# Patient Record
Sex: Male | Born: 1950 | Hispanic: Yes | Marital: Married | Smoking: Former smoker
Health system: Southern US, Community
[De-identification: ages and names within clinical notes are randomized; demographics above are authoritative.]

## PROBLEM LIST (undated history)

## (undated) DIAGNOSIS — E119 Type 2 diabetes mellitus without complications: Secondary | ICD-10-CM

## (undated) DIAGNOSIS — E785 Hyperlipidemia, unspecified: Secondary | ICD-10-CM

## (undated) HISTORY — DX: Hyperlipidemia, unspecified: E78.5

## (undated) HISTORY — PX: APPENDECTOMY: SHX54

---

## 2014-03-22 ENCOUNTER — Emergency Department (HOSPITAL_COMMUNITY)
Admission: EM | Admit: 2014-03-22 | Discharge: 2014-03-22 | Disposition: A | Discharge status: Home / Self Care | Payer: Self-pay | Attending: Emergency Medicine | Admitting: Emergency Medicine

## 2014-03-22 ENCOUNTER — Encounter (HOSPITAL_COMMUNITY): Payer: Self-pay | Admitting: Emergency Medicine

## 2014-03-22 DIAGNOSIS — E875 Hyperkalemia: Secondary | ICD-10-CM

## 2014-03-22 DIAGNOSIS — E119 Type 2 diabetes mellitus without complications: Secondary | ICD-10-CM | POA: Insufficient documentation

## 2014-03-22 DIAGNOSIS — R739 Hyperglycemia, unspecified: Secondary | ICD-10-CM

## 2014-03-22 HISTORY — DX: Type 2 diabetes mellitus without complications: E11.9

## 2014-03-22 LAB — URINALYSIS, ROUTINE W REFLEX MICROSCOPIC
BILIRUBIN URINE: NEGATIVE
Hgb urine dipstick: NEGATIVE
KETONES UR: NEGATIVE mg/dL
Leukocytes, UA: NEGATIVE
Nitrite: NEGATIVE
PH: 5.5 (ref 5.0–8.0)
PROTEIN: NEGATIVE mg/dL
Specific Gravity, Urine: 1.01 (ref 1.005–1.030)
Urobilinogen, UA: 0.2 mg/dL (ref 0.0–1.0)

## 2014-03-22 LAB — CBC WITH DIFFERENTIAL/PLATELET
BASOS ABS: 0 10*3/uL (ref 0.0–0.1)
Basophils Relative: 0 % (ref 0–1)
EOS ABS: 0.1 10*3/uL (ref 0.0–0.7)
Eosinophils Relative: 2 % (ref 0–5)
HEMATOCRIT: 42.4 % (ref 39.0–52.0)
Hemoglobin: 14.5 g/dL (ref 13.0–17.0)
LYMPHS PCT: 27 % (ref 12–46)
Lymphs Abs: 1.6 10*3/uL (ref 0.7–4.0)
MCH: 30.9 pg (ref 26.0–34.0)
MCHC: 34.2 g/dL (ref 30.0–36.0)
MCV: 90.2 fL (ref 78.0–100.0)
MONO ABS: 0.5 10*3/uL (ref 0.1–1.0)
Monocytes Relative: 9 % (ref 3–12)
Neutro Abs: 3.7 10*3/uL (ref 1.7–7.7)
Neutrophils Relative %: 62 % (ref 43–77)
Platelets: 278 10*3/uL (ref 150–400)
RBC: 4.7 MIL/uL (ref 4.22–5.81)
RDW: 12.2 % (ref 11.5–15.5)
WBC: 5.9 10*3/uL (ref 4.0–10.5)

## 2014-03-22 LAB — BASIC METABOLIC PANEL
BUN: 16 mg/dL (ref 6–23)
CO2: 31 meq/L (ref 19–32)
CREATININE: 0.75 mg/dL (ref 0.50–1.35)
Calcium: 9.7 mg/dL (ref 8.4–10.5)
Chloride: 96 mEq/L (ref 96–112)
GFR calc Af Amer: >90 mL/min (ref >90)
GFR calc non Af Amer: >90 mL/min (ref >90)
GLUCOSE: 457 mg/dL — AB (ref 70–99)
Potassium: 5.7 mEq/L — ABNORMAL HIGH (ref 3.7–5.3)
Sodium: 137 mEq/L (ref 137–147)

## 2014-03-22 LAB — I-STAT CHEM 8, ED
BUN: 14 mg/dL (ref 6–23)
CREATININE: 0.8 mg/dL (ref 0.50–1.35)
Calcium, Ion: 1.23 mmol/L (ref 1.13–1.30)
Chloride: 102 mEq/L (ref 96–112)
Glucose, Bld: 94 mg/dL (ref 70–99)
HCT: 48 % (ref 39.0–52.0)
Hemoglobin: 16.3 g/dL (ref 13.0–17.0)
Potassium: 3.4 mEq/L — ABNORMAL LOW (ref 3.7–5.3)
SODIUM: 143 meq/L (ref 137–147)
TCO2: 26 mmol/L (ref 0–100)

## 2014-03-22 LAB — URINE MICROSCOPIC-ADD ON

## 2014-03-22 LAB — CBG MONITORING, ED
Glucose-Capillary: 418 mg/dL — ABNORMAL HIGH (ref 70–99)
Glucose-Capillary: 91 mg/dL (ref 70–99)

## 2014-03-22 MED ORDER — METFORMIN HCL 500 MG PO TABS: 500.0000 mg | ORAL_TABLET | Freq: Two times a day (BID) | ORAL | Status: DC

## 2014-03-22 MED ORDER — SODIUM CHLORIDE 0.9 % IV BOLUS (SEPSIS)
1000.0000 mL | Freq: Once | INTRAVENOUS | Status: AC
  Administered 2014-03-22: 1000 mL via INTRAVENOUS

## 2014-03-22 MED ORDER — FUROSEMIDE 10 MG/ML IJ SOLN
40.0000 mg | Freq: Once | INTRAMUSCULAR | Status: AC
  Administered 2014-03-22: 40 mg via INTRAVENOUS
  Filled 2014-03-22: qty 4

## 2014-03-22 MED ORDER — INSULIN ASPART 100 UNIT/ML IV SOLN
10.0000 [IU] | Freq: Once | INTRAVENOUS | Status: AC
  Administered 2014-03-22: 10 [IU] via INTRAVENOUS

## 2014-03-22 MED ORDER — METFORMIN HCL 500 MG PO TABS
500.0000 mg | ORAL_TABLET | Freq: Once | ORAL | Status: AC
  Administered 2014-03-22: 500 mg via ORAL
  Filled 2014-03-22: qty 1

## 2014-03-22 NOTE — Discharge Instructions (Signed)
It is important to continue to take all medication as directed and have your blood rechecked in one week.  Return here for any concerning changes in your condition.   Hiperglucemia (Hyperglycemia) La hiperglucemia ocurre cuando la glucosa (azcar) en su sangre est demasiado elevada. Puede suceder por varias razones, pero a menudo ocurre en personas que no saben que tienen diabetes o no la controlan adecuadamente.  CAUSAS Tanto si tiene diabetes como si no, existen otras causas para la hiperglucemia. La hiperglucemia puede producirse cuando tiene diabetes, pero tambin puede presentarse en otras situaciones de las que podra no ser consciente, como por ejemplo: Diabetes  Si tiene diabetes y tiene problemas para controlar su glucosa en sangre, la hiperglucemia podra producirse debido a las siguientes razones:  No seguir Press photographerel plan de alimentacin.  No tomar los medicamentos para la diabetes o tomarlos de forma inadecuada.  Realizar menos ejercicio del que normalmente hace.  Estar enfermo. Prediabetes  Esto no puede ignorarse. Antes de que la persona presente diabetes de tipo 2, casi siempre hay "prediabetes". Esto ocurre cuando su glucosa en sangre es mayor que lo normal, pero no lo suficiente como para diagnosticar diabetes. La investigacin ha demostrado que algunos daos al cuerpo de Air cabin crewlargo plazo, en especial los del corazn y el sistema circulatorio, podran haber ocurrido durante el periodo de prediabetes. Si controla la glucosa en sangre cuando tiene prediabetes, podr retardar o evitar que se desarrrolle la diabetes tipo 2. El estrs  Si tiene diabetes, deber hacer una dieta, tomar medicamentos orales o insulina para Morris Chapelcontrolarla. Sin embargo, Clinical research associateencontrar que la glucosa en sangre es mayor que lo normal en el hospital tenga o no diabetes. Cientficamente se lo denomina "hiperglucemia por estrs". El estrs puede elevar su glucosa en sangre. Esto ocurre porque el organismo genera hormonas  en los momentos de estrs. Si el estrs ha Loews Corporationsido la causa del alto nivel de glucosa en [place]re, Oregonel mdico podr Education officer, environmentalrealizar un seguimiento de Summersetmanera regular. Feliberto Hartse esta manera, podr asegurarse de que la hiperglucemia no empeora o progresa hacia diabetes. Esteroides  Los esteroides son medicamentos que actan en la infeccin que ataca al sistema inmunolgico para bloquear la inflamacin o la infeccin. Un efecto secundario puede ser el aumento de glucosa en [place]angre. Muchas personas pueden producir la suficiente insulina extra para este aumento, pero aquellos que no pueden, los esteroides pueden Group 1 Automotivehacer que los niveles sean an Pe Ellmayores. No es inusual que los tratamientos con esteorides "destapen" una diabetes que se est desarrollando. No siempre es posible determinar si la hiperglucemia desaparecer una vez que se detenga el consumo de esteroides. A veces se realiza un anlisis de sangre especial denominado A1c para determinar si la glucosa en sangre se ha elevado antes de comenzar con el consumo de esteroides. SNTOMAS  Sed.  Necesidad frecuente de Geographical information systems officerorinar.  M.D.C. HoldingsBoca seca.  Visin borrosa.  Cansancio o fatiga.  Debilidad.  Somnolencia.  Hormigueo en el pie o pierna. DIAGNSTICO El diagnstico se realiza mediante el control de la glucosa en sangre de una o varias de las siguientes maneras:  Anlisis A1c. Es una sustancia qumica que se encuentra en la [place]re.  Control de glucosa en sangre con tiras de prueba.  Resultados de laboratorio. TRATAMIENTO Primero, es importante conocer la causa de la hiperglucemia antes de tratarla. El tratamiento puede ser el siguiente, [place]unque pueden ser otros:  Educacin  Cambios o ajustes en los medicamentos.  Cambios o ajustes en el plan de alimentacin.  Tratamiento por enfermedades, infecciones, etc.  Control de glucosa en sangre ms frecuente.  Cambios en el plan de ejercicios.  Disminucin o interrupcin del consumo de esteroides.  Cambios en el estilo  de vida. INSTRUCCIONES PARA EL CUIDADO DOMICILIARIO  Contrlese la glucosa en sangre, como se lo indicaron.  Haga ejercicios regularmente. El profesional que lo asiste le dar instrucciones relacionadas con el ejercicio fsico. La prediabetes que es consecuencia de situaciones de estrs, puede mejorar con la actividad fsica.  Consuma alimentos saludables y balanceados. Coma a menudo y de [place] regular, en momentos fijos. El profesional o el nutricionista le dar una dieta especial para controlar su ingestin de azcar.  Mantener su peso ideal es importante. Si lo necesita, perder un poco de peso, como 5  7 Kg. puede ser beneficioso para [employer] niveles de Production assistant, radio. SOLICITE ATENCIN MDICA SI:  Tiene preguntas relacionadas con los medicamentos, la actividad o la dieta.  Contina teniendo sntomas (como mucha sed, deseos intensos de Geographical information systems officer o aumento de peso) SOLICITE ATENCIN MDICA DE INMEDIATO SI:  Vomita o tiene diarrea.  Su respiracin huele frutal.  La frecuencia respiratoria es ms rpida o ms lenta.  Est somnoliento o incoherente.  Siente adormecimiento, hormigueos o Tax adviser o en las manos.  Siente dolor en el pecho.  Sus sntomas empeoran aunque haya seguido las indicaciones de su mdico.  Tiene otras preguntas o preocupaciones. Document Released: 11/23/2005 Document Revised: 02/15/2012 [facility] Patient Information 2014 [place], [place].  Hiperkalemia (Hyperkalemia) La hiperkalemia se produce cuando hay demasiado potasio en la sangre. Puede poner en peligro la vida. Normalmente los riones eliminan (excretan)el potasio del organismo.  CAUSAS Los niveles de potasio pueden elevarse de los siguientes modos:  Usted toma demasiado potasio. Lo hace cuando:  Utiliza sustitutos de la sal. Ellos contienen grandes cantidades.  El mdico le indica suplementos de potasio. La dosis puede ser muy alta para usted.  Consume alimentos o productos  dietticos con potasio.  Elimina (excreta) muy poco potasio. Esto puede ocurrir si:  Sus riones no funcionan correctamente. Las enfermedades renales son [place]uffel muy frecuente de hiperkalemia.  Toma medicamentos que disminuyen la excrecin de potasio (por ejemplo, ciertos diurticos).  Sufre una enfermedad de la glndula suprarrenal llamada enfermedad de [place].  Tiene una obstruccin en el tracto urinario, como clculos en los riones.  Est en tratamiento para limpiar mecnicamente la sangre (dilisis)y saltea un tratamiento.  Libera gran cantidad de potasio desde las clulas al torrente sanguneo. Puede sufrir una enfermedad que hace que el potasio vaya desde las clulas al torrente sanguneo. Esto puede ocurrir si:  Hay una lesin [employer] u otros tejidos. La mayor parte del potasio se encuentra en los msculos.  Tambin las quemaduras graves o las infecciones.  Hay acidificacin del plasma sanguneo o acidosis. La acidosis puede ser el resultado de muchas enfermedades, como diabetes sin controlarse. SNTOMAS Generalmente no hay sntomas, excepto que el potasio est peligrosamente elevado o se ha elevado muy rpidamente. Los sntomas son:  Latidos cardacos irregulares o muy lentos.  Ganas de vomitar (nuseas).  Cansancio intenso (fatiga).  Problemas neurolgicos tales como hormigueos en la piel, adormecimiento de manos o pies, debilidad o parlisis. DIAGNSTICO Un simple anlisis de sangre puede medir la cantidad de potasio en el organismo. Un electrocardiograma tambin ayuda a realizar el diagnstico. Cuando la hiperkalemia es grave, el corazn puede latir peligrosamente rpido, lento o detenerse completamente.  TRATAMIENTO El tratamiento depende de la gravedad de la enfermedad y de la causa subyacente.  En caso de ser Neomia Dearuna emergencia (causa problemas cardacos o parlisis), podrn indicarle diferentes medicamentos que se usan solos o en conjunto para disminuir el  nivel de potasio rpidamente. Podrn indicarle una inyeccin de insulina an si no es diabtico. Podra ser necesario que se someta a una limpieza mecnica de emergencia de la sangre (dilisis) para retirar el potasio del organismo.  Si la hipercalemia es menos grave, se trata la causa subyacente. Si es Copywriter, advertisingnecesario le prescribirn medicamentos. Podrn cambiarle los Cardinal Healthmedicamentos que le prescriben. Adems necesitar un medicamento que ayude a su organismo a Psychologist, counsellingeliminar potasio. Deber consumir una dieta baja en potasio. INSTRUCCIONES PARA EL CUIDADO DOMICILIARIO  Baxter Internationalome los medicamentos y los suplementos como le indic el profesional que lo asiste.  NO tome ningn medicamento de venta libre, ni suplementos, productos naturales, hierbas o vitaminas sin consultar con su mdico. Ciertos suplementos y productos naturales pueden contener gran cantidad de potasio. Otros productos (como el Ibuprofeno) pueden daar los riones debilitados y aumentar el nivel de potasio.  Le solicitarn que repita los anlisis de laboratorio. Asegrese de seguir todas las directivas.  Si sufre una enfermedad renal, podr ser necesario que siga una dieta baja en potasio. SOLICITE ATENCIN MDICA SI:  Nota latidos irregulares o muy lentos.  Se siente mareado.  Comienza a sentirse dbil y sto no es habitual. SOLICITE ATENCIN MDICA DE INMEDIATO SI:  Comienza a sentir falta de aire.  Siente molestias en el pecho.  Se desmaya(se desvanece). EST SEGURO QUE:   Comprende las instrucciones para el alta mdica.  Controlar su enfermedad.  Solicitar atencin mdica de inmediato segn las indicaciones. Document Released: 11/23/2005 Document Revised: 02/15/2012 [facility] Patient Information 2014 [place], [place].

## 2014-03-22 NOTE — ED Notes (Signed)
MD at bedside. 

## 2014-03-22 NOTE — ED Provider Notes (Signed)
CSN: [id number]     Arrival date & time 03/22/14  1040 History  This chart was scribed for Gerhard Munchobert Marquize Seib, MD by Quintella ReichertMatthew Underwood, ED scribe.  This patient was seen in room APA19/APA19 and the patient's care was started at 11:12 AM.   Chief Complaint  Patient presents with  . Hyperglycemia    The history is provided by the patient. No language interpreter was used.    HPI Comments: Jaci CarrelSantos Everard is a 63 y.o. male with h/o DM who presents to the Emergency Department complaining of hyperglycemia that he first noticed this morning.  [facility] and was [facility] his CBG was 446.  He reports he feels fine and denies pain to any area.  He denies nausea, lightheadedness, or any other associated symptoms.  Pt does not use any medications for his DM.  He used to be on medications but has not received any for at least the past 2 years.  He denies any other chronic medical conditions or regular medication usage.    Past Medical History  Diagnosis Date  . Diabetes mellitus without complication     History reviewed. No pertinent past surgical history.  No family history on file.   History  Substance Use Topics  . Smoking status: Never Smoker   . Smokeless tobacco: Not on file  . Alcohol Use: Yes     Review of Systems  Constitutional:       Per HPI, otherwise negative  HENT:       Per HPI, otherwise negative  Respiratory:       Per HPI, otherwise negative  Cardiovascular:       Per HPI, otherwise negative  Gastrointestinal: Negative for vomiting.  Endocrine:       Negative aside from HPI  Genitourinary:       Neg aside from HPI   Musculoskeletal:       Per HPI, otherwise negative  Skin: Negative.   Neurological: Negative for syncope.      Allergies  Review of patient's allergies indicates no known allergies.  Home Medications   Prior to Admission medications   Not on File   BP 119/72  Pulse 84  Temp(Src) 98.3 F  (36.8 C)  Resp 18  Ht 5\' 2"  (1.575 m)  Wt 135 lb (61.236 kg)  BMI 24.69 kg/m2  SpO2 99%  Physical Exam  Nursing note and vitals reviewed. Constitutional: He is oriented to person, place, and time. He appears well-developed. No distress.  HENT:  Head: Normocephalic and atraumatic.  Eyes: Conjunctivae and EOM are normal.  Cardiovascular: Normal rate, regular rhythm and normal heart sounds.   No murmur heard. Pulmonary/Chest: Effort normal and breath sounds normal. No stridor. No respiratory distress. He has no wheezes. He has no rales.  Abdominal: He exhibits no distension.  Musculoskeletal: He exhibits no edema.  Neurological: He is alert and oriented to person, place, and time.  Skin: Skin is warm and dry.  Psychiatric: He has a normal mood and affect.    ED Course  Procedures (including critical care time)  DIAGNOSTIC STUDIES: Oxygen Saturation is 99% on room air, normal by my interpretation.    COORDINATION OF CARE: 11:15 AM-Discussed treatment plan which includes bloodwork and UA with pt at bedside and pt agreed to plan.     Labs Review Labs Reviewed  BASIC METABOLIC PANEL - Abnormal; Notable for the following:    Potassium 5.7 (*)  Glucose, Bld 457 (*)    All other components within normal limits  URINALYSIS, ROUTINE W REFLEX MICROSCOPIC - Abnormal; Notable for the following:    Glucose, UA >1000 (*)    All other components within normal limits  CBG MONITORING, ED - Abnormal; Notable for the following:    Glucose-Capillary 418 (*)    All other components within normal limits  CBC WITH DIFFERENTIAL  URINE MICROSCOPIC-ADD ON    Update: Repeat potassium level is normal. On repeat exam the patient is in no distress.  We discussed all findings at length.  Patient will follow up with primary care for repeat labs in one week.   EKG Interpretation   Date/Time:  Thursday March 22 2014 13:51:01 EDT Ventricular Rate:  83 PR Interval:  138 QRS Duration: 96 QT  Interval:  382 QTC Calculation: 448 R Axis:   63 Text Interpretation:  Normal sinus rhythm Nonspecific ST and T wave  abnormality Abnormal ECG No previous ECGs available Sinus rhythm ST-t wave  abnormality Abnormal ekg Confirmed by Gerhard MunchLOCKWOOD, Marco Adelson  MD [phone]([id number]) on  03/22/2014 3:11:43 PM      MDM   Final diagnoses:  Hyperkalemia  Hyperglycemia     I personally performed the services described in this documentation, which was scribed in my presence. The recorded information has been reviewed and is accurate.   This generally well-appearing male presents due to concerns of hyperglycemia.  Patient has no physical complaints, and is hemodynamically stable.  Patient's initial evaluation demonstrates hyperglycemia, hyperkalemia.  There are no significant T wave changes on EKG.  Following medication for his elevated potassium patient's potassium level returned to normal.  With no ongoing concerns discharge after initiation of metformin, with primary care followup    Gerhard Munchobert Amol Domanski, MD 03/22/14 1513

## 2014-03-22 NOTE — ED Notes (Signed)
[facility]. Pt states he feels fine.

## 2014-03-22 NOTE — ED Notes (Signed)
CBG=418 

## 2017-04-03 ENCOUNTER — Emergency Department (HOSPITAL_COMMUNITY): Payer: Self-pay

## 2017-04-03 ENCOUNTER — Emergency Department (HOSPITAL_COMMUNITY)
Admission: EM | Admit: 2017-04-03 | Discharge: 2017-04-03 | Disposition: A | Discharge status: Home / Self Care | Payer: Self-pay | Attending: Emergency Medicine | Admitting: Emergency Medicine

## 2017-04-03 ENCOUNTER — Encounter (HOSPITAL_COMMUNITY): Payer: Self-pay | Admitting: Emergency Medicine

## 2017-04-03 DIAGNOSIS — E119 Type 2 diabetes mellitus without complications: Secondary | ICD-10-CM | POA: Insufficient documentation

## 2017-04-03 DIAGNOSIS — Z23 Encounter for immunization: Secondary | ICD-10-CM | POA: Insufficient documentation

## 2017-04-03 DIAGNOSIS — Y929 Unspecified place or not applicable: Secondary | ICD-10-CM | POA: Insufficient documentation

## 2017-04-03 DIAGNOSIS — Y999 Unspecified external cause status: Secondary | ICD-10-CM | POA: Insufficient documentation

## 2017-04-03 DIAGNOSIS — Y9389 Activity, other specified: Secondary | ICD-10-CM | POA: Insufficient documentation

## 2017-04-03 DIAGNOSIS — W230XXA Caught, crushed, jammed, or pinched between moving objects, initial encounter: Secondary | ICD-10-CM | POA: Insufficient documentation

## 2017-04-03 DIAGNOSIS — S62639B Displaced fracture of distal phalanx of unspecified finger, initial encounter for open fracture: Secondary | ICD-10-CM

## 2017-04-03 DIAGNOSIS — S62632B Displaced fracture of distal phalanx of right middle finger, initial encounter for open fracture: Secondary | ICD-10-CM | POA: Insufficient documentation

## 2017-04-03 MED ORDER — TETANUS-DIPHTH-ACELL PERTUSSIS 5-2.5-18.5 LF-MCG/0.5 IM SUSP
0.5000 mL | Freq: Once | INTRAMUSCULAR | Status: AC
  Administered 2017-04-03: 0.5 mL via INTRAMUSCULAR
  Filled 2017-04-03: qty 0.5

## 2017-04-03 MED ORDER — IBUPROFEN 800 MG PO TABS: 800.0000 mg | ORAL_TABLET | Freq: Three times a day (TID) | ORAL | 0 refills | Status: DC

## 2017-04-03 MED ORDER — CEPHALEXIN 500 MG PO CAPS: 500.0000 mg | ORAL_CAPSULE | Freq: Four times a day (QID) | ORAL | 0 refills | Status: DC

## 2017-04-03 MED ORDER — CEPHALEXIN 500 MG PO CAPS
500.0000 mg | ORAL_CAPSULE | Freq: Once | ORAL | Status: AC
  Administered 2017-04-03: 500 mg via ORAL
  Filled 2017-04-03: qty 1

## 2017-04-03 MED ORDER — LIDOCAINE HCL (PF) 2 % IJ SOLN
10.0000 mL | Freq: Once | INTRAMUSCULAR | Status: DC
  Filled 2017-04-03: qty 10

## 2017-04-03 NOTE — ED Triage Notes (Signed)
Per patient right middle finger caught in machine.  Pt has a 2 inch laceration on the side of middle finger and skin is also peeled back on the top of the finger.

## 2017-04-03 NOTE — ED Notes (Signed)
TT continues to repair

## 2017-04-03 NOTE — ED Provider Notes (Signed)
[facility]PT Provider Note   CSN: [id number] Arrival date & time: 04/03/17  1758     History   Chief Complaint Chief Complaint  Patient presents with  . Laceration    right hand middle finger    HPI William [name] is a 66 y.o. male.  HPI   William [name] is a 66 y.o. male who presents to the Emergency Department complaining of laceration to the right middle finger.  He states that he was using a Economist and accidentally caught his finger in the mechine as it was turning.  Complains of pain and laceration to the top and side of the finger.  He has not cleaned the wound.  Incident occurred just before ER arrival.  He denies swelling, excessive bleeding, numbness or weakness of the finger.   Last Td is unknown  Past Medical History:  Diagnosis Date  . Diabetes mellitus without complication (HCC)     There are no active problems to display for this patient.   Past Surgical History:  Procedure Laterality Date  . APPENDECTOMY         Home Medications    Prior to Admission medications   Not on File    Family History No family history on file.  Social History Social History  Substance Use Topics  . Smoking status: Never Smoker  . Smokeless tobacco: Not on file  . Alcohol use Yes     Allergies   Patient has no known allergies.   Review of Systems Review of Systems  Constitutional: Negative for chills and fever.  Musculoskeletal: Negative for arthralgias, back pain and joint swelling.  Skin: Positive for wound.       Laceration right middle finger  Neurological: Negative for dizziness, weakness and numbness.  Hematological: Does not bruise/bleed easily.  All other systems reviewed and are negative.    Physical Exam Updated Vital Signs BP (!) 160/70 (BP Location: Left Arm)   Pulse 92   Temp 99.2 F (37.3 C) (Oral)   Resp 20   Ht  (1.575 m)   Wt 59 kg   SpO2 100%   BMI 23.78 kg/m   Physical Exam  Constitutional: He is  oriented to person, place, and time. He appears well-developed and well-nourished. No distress.  HENT:  Head: Normocephalic and atraumatic.  Cardiovascular: Normal rate, regular rhythm and intact distal pulses.   No murmur heard. Pulmonary/Chest: Effort normal and breath sounds normal. No respiratory distress.  Musculoskeletal: He exhibits no edema.       Right hand: He exhibits tenderness and laceration. He exhibits normal range of motion, normal capillary refill, no deformity and no swelling. Normal sensation noted. Normal strength noted. He exhibits no finger abduction and no thumb/finger opposition.       Hands: Flap type laceration to the dorsal aspect of the distal right middle finger.  Laceration to the unlar aspect of the same finger.  Bleeding controlled.  extensor tendon of the finger visualized w/o obvious injury.  No FB's seen.   Neurological: He is alert and oriented to person, place, and time. He exhibits normal muscle tone. Coordination normal.  Skin: Skin is warm. Capillary refill takes less than 2 seconds.  Nursing note and vitals reviewed.    ED Treatments / Results  Labs (all labs ordered are listed, but only abnormal results are displayed) Labs Reviewed - No data to display  EKG  EKG Interpretation None       Radiology Dg Finger Middle Right  Result Date: 04/03/2017 CLINICAL DATA:  Caught finger in tire changing machine with laceration, initial encounter EXAM: RIGHT MIDDLE FINGER 2+V COMPARISON:  None. FINDINGS: Considerable soft tissue defect is noted in the third digit consistent with the recent injury. Mild avulsion from the posterior aspect of the distal phalanx proximally is noted. A tiny density is also noted within the soft tissues only on the lateral projection which may be related to a small foreign body. Some foreign bodies are noted within the soft tissues of the distal aspect of the fourth digit. These are likely chronic. IMPRESSION: Soft tissue changes  in the third digit. Small avulsion fracture at the base of the third distal phalanx. Electronically Signed   By: Alcide Clever M.D.   On: 04/03/2017 18:48    Procedures Procedures (including critical care time)  LACERATION REPAIR #1 Performed by: Shianne Zeiser L. Authorized by: Maxwell Caul Consent: Verbal consent obtained. Risks and benefits: risks, benefits and alternatives were discussed Consent given by: patient Patient identity confirmed: provided demographic data Prepped and Draped in normal sterile fashion Wound explored  Laceration Location: dorsal right middle finger  Laceration Length: 2 cm  No Foreign Bodies seen or palpated  Anesthesia: digital block  Local anesthetic: lidocaine 2 % w/o epinephrine  Anesthetic total: 2 ml  Irrigation method: syringe Amount of cleaning: thorough cleaning with saline and betadine scrub Skin closure: 4-0 ethilon  Number of sutures: 4  Technique: simple interrupted  Patient tolerance: Patient tolerated the procedure well with no immediate complications.   LACERATION REPAIR #2  Performed by: Lockie Bothun L. Authorized by: Maxwell Caul Consent: Verbal consent obtained. Risks and benefits: risks, benefits and alternatives were discussed Consent given by: patient Patient identity confirmed: provided demographic data Prepped and Draped in normal sterile fashion Wound explored  Laceration Location: unlar aspect of right middle finger  Laceration Length: 4 cm  No Foreign Bodies seen or palpated  Anesthesia: digital block  Local anesthetic: lidocaine 2 % w/o epinephrine  Anesthetic total: 3 ml  Irrigation method: syringe Amount of cleaning: thorough cleaning with saline and betadine scrub Skin closure: 4-0 Ethilon  Number of sutures: 8  Technique: simple interrupted  Patient tolerance: Patient tolerated the procedure well with no immediate complications.  Medications Ordered in ED Medications    lidocaine (XYLOCAINE) 2 % injection 10 mL (10 mLs Intradermal Handoff 04/03/17 1938)  cephALEXin (KEFLEX) capsule 500 mg (not administered)  Tdap (BOOSTRIX) injection 0.5 mL (0.5 mLs Intramuscular Given 04/03/17 1928)     Initial Impression / Assessment and Plan / ED Course  I have reviewed the triage vital signs and the nursing notes.  Pertinent labs & imaging results that were available during my care of the patient were reviewed by me and considered in my medical decision making (see chart for details).    Pt also examined by Dr. Juleen China and care plan discussed prior to closure.    Extensor tendon distal finger visualized w/o obvious injury.  NV intact, nail intact.    Td updated, wound cleaned.  Rx for keflex.  Pt agrees to return here in 2 days for recheck.    Final Clinical Impressions(s) / ED Diagnoses   Final diagnoses:  Open avulsion fracture of distal phalanx of finger, initial encounter    New Prescriptions New Prescriptions   No medications on file     Rosey Bath 04/08/17 1628    Raeford Razor, MD 04/10/17 [phone]

## 2017-04-03 NOTE — ED Notes (Signed)
TT in room repairing finger

## 2017-04-03 NOTE — Discharge Instructions (Signed)
Clean the wound with mild soap and water and keep it bandaged and splinted.  Return here on Monday after 4:00 pm for recheck or sooner for any signs of infection.  Call the orthopedic doctor listed to make a follow-up appt for next week.  Sutures out in 10 days

## 2017-04-05 ENCOUNTER — Encounter (HOSPITAL_COMMUNITY): Payer: Self-pay

## 2017-04-05 ENCOUNTER — Emergency Department (HOSPITAL_COMMUNITY)
Admission: EM | Admit: 2017-04-05 | Discharge: 2017-04-05 | Disposition: A | Discharge status: Home / Self Care | Payer: Self-pay | Attending: Emergency Medicine | Admitting: Emergency Medicine

## 2017-04-05 DIAGNOSIS — S61212D Laceration without foreign body of right middle finger without damage to nail, subsequent encounter: Secondary | ICD-10-CM

## 2017-04-05 DIAGNOSIS — S62639D Displaced fracture of distal phalanx of unspecified finger, subsequent encounter for fracture with routine healing: Secondary | ICD-10-CM

## 2017-04-05 DIAGNOSIS — S62632D Displaced fracture of distal phalanx of right middle finger, subsequent encounter for fracture with routine healing: Secondary | ICD-10-CM | POA: Insufficient documentation

## 2017-04-05 DIAGNOSIS — E119 Type 2 diabetes mellitus without complications: Secondary | ICD-10-CM | POA: Insufficient documentation

## 2017-04-05 DIAGNOSIS — W230XXD Caught, crushed, jammed, or pinched between moving objects, subsequent encounter: Secondary | ICD-10-CM | POA: Insufficient documentation

## 2017-04-05 NOTE — ED Provider Notes (Signed)
[facility]-EM[facility]Y DEPT Provider Note   CSN: [id number] Arrival date & time: 04/05/17  1553  By signing my name below, I, Cynda Acres, attest that this documentation has been prepared under the direction and in the presence of Maylyn Narvaiz PA-C. Electronically Signed: Cynda Acres, Scribe. 04/05/17. 5:15 PM.  History   Chief Complaint Chief Complaint  Patient presents with  . Follow-up   HPI Comments: William [name] is a 66 y.o. male with a history of diabetes mellitus, who presents to the Emergency Department for a follow-up requested by me for 4 cm finger laceration that appeared two days ago. Patient has  sutures to the right middle finger. Patient does have a fracture to the right middle finger, in which he has not followed up with Dr. Romeo Apple yet as discussed. Patient reports pain is improving.  Patient has been taking his antibiotics as prescribed. Patient denies any fever, chills, discharge, nausea, vomiting, weakness or numbness of the finger.   The history is provided by the patient. No language interpreter was used.    Past Medical History:  Diagnosis Date  . Diabetes mellitus without complication (HCC)     There are no active problems to display for this patient.   Past Surgical History:  Procedure Laterality Date  . APPENDECTOMY         Home Medications    Prior to Admission medications   Medication Sig Start Date End Date Taking? Authorizing Provider  cephALEXin (KEFLEX) 500 MG capsule Take 1 capsule (500 mg total) by mouth 4 (four) times daily. For 10 days 04/03/17   Bohdi Leeds, PA-C  ibuprofen (ADVIL,MOTRIN) 800 MG tablet Take 1 tablet (800 mg total) by mouth 3 (three) times daily. 04/03/17   Mccauley Diehl, PA-C    Family History No family history on file.  Social History Social History  Substance Use Topics  . Smoking status: Never Smoker  . Smokeless tobacco: Never Used  . Alcohol use Yes     Allergies   Patient has no known  allergies.   Review of Systems Review of Systems  Constitutional: Negative for chills and fever.  Gastrointestinal: Negative for nausea and vomiting.  Musculoskeletal: Positive for arthralgias (right middle finger) and joint swelling (right middle finger).     Physical Exam Updated Vital Signs BP (!) 104/58 (BP Location: Left Arm)   Pulse (!) 105   Temp 97.8 F (36.6 C) (Oral)   Resp 16   Ht  (1.575 m)   Wt 130 lb (59 kg)   SpO2 99%   BMI 23.78 kg/m   Physical Exam  Constitutional: He is oriented to person, place, and time. He appears well-developed.  HENT:  Head: Normocephalic and atraumatic.  Mouth/Throat: Oropharynx is clear and moist.  Neck: Normal range of motion. Neck supple.  Cardiovascular: Normal rate, regular rhythm and intact distal pulses.   Pulmonary/Chest: Effort normal. No respiratory distress.  Musculoskeletal: Normal range of motion. He exhibits edema. He exhibits no tenderness or deformity.  mild edema of the right middle finger. Laceration appears to be healing well. No erythema, red streaking or drainage. No tenderness of the tendon. Pt moves digit w/o difficulty  Neurological: He is alert and oriented to person, place, and time. No sensory deficit.  Skin: Skin is warm and dry. Capillary refill takes less than 2 seconds.  Psychiatric: He has a normal mood and affect.  Nursing note and vitals reviewed.          ED Treatments / Results  DIAGNOSTIC STUDIES: Oxygen Saturation is 99% on RA, normal by my interpretation.    COORDINATION OF CARE: 5:13 PM Discussed treatment plan with pt at bedside and pt agreed to plan, which includes a hand surgeon follow-up.   Labs (all labs ordered are listed, but only abnormal results are displayed) Labs Reviewed - No data to display  EKG  EKG Interpretation None       Radiology Dg Finger Middle Right  Result Date: 04/03/2017 CLINICAL DATA:  Caught finger in tire changing machine with  laceration, initial encounter EXAM: RIGHT MIDDLE FINGER 2+V COMPARISON:  None. FINDINGS: Considerable soft tissue defect is noted in the third digit consistent with the recent injury. Mild avulsion from the posterior aspect of the distal phalanx proximally is noted. A tiny density is also noted within the soft tissues only on the lateral projection which may be related to a small foreign body. Some foreign bodies are noted within the soft tissues of the distal aspect of the fourth digit. These are likely chronic. IMPRESSION: Soft tissue changes in the third digit. Small avulsion fracture at the base of the third distal phalanx. Electronically Signed   By: Alcide Clever M.D.   On: 04/03/2017 18:48    Procedures Procedures (including critical care time)  Medications Ordered in ED Medications - No data to display   Initial Impression / Assessment and Plan / ED Course  I have reviewed the triage vital signs and the nursing notes.  Pertinent labs & imaging results that were available during my care of the patient were reviewed by me and considered in my medical decision making (see chart for details).     Laceration appears to be healing well.  No clinical signs of infection.  NV intact,  CR< 2 sec.    Pt again advised to f/u with orthopedics.  Finger bandaged and re-splinted.   Final Clinical Impressions(s) / ED Diagnoses   Final diagnoses:  Laceration of right middle finger without foreign body without damage to nail, subsequent encounter  Open avulsion fracture of distal phalanx of finger with routine healing, subsequent encounter    New Prescriptions New Prescriptions   No medications on file   I personally performed the services described in this documentation, which was scribed in my presence. The recorded information has been reviewed and is accurate.    Pauline Aus, PA-C 04/07/17 2122    Vanetta Mulders, MD 04/08/17 [phone]

## 2017-04-05 NOTE — Discharge Instructions (Signed)
Clean the finger with mild soap and water and keep it splinted and bandaged.  Call Dr. Mort Sawyers office to arrange a follow-up.  Sutures out in 10 days

## 2017-04-05 NOTE — ED Triage Notes (Signed)
Pt in for recheck of right middle finger laceration.  Pt states no pain

## 2017-04-08 NOTE — ED Provider Notes (Signed)
Medical screening examination/treatment/procedure(s) were conducted as a shared visit with non-physician practitioner(s) and myself.  I personally evaluated the patient during the encounter.   EKG Interpretation None      65yM with lacerations R middle after catching it in tire removal machine.  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 Copiously irrigated and no obvious FB. Skin loosely closed. Abx. Ortho FU. Pt advised to come back in 24-48 hours for wound check if he cannot get into ortho. Return precautions sooner discussed.    Raeford RazorStephen Veyda Kaufman, MD 04/08/17 [phone]

## 2017-04-13 ENCOUNTER — Encounter (HOSPITAL_COMMUNITY): Payer: Self-pay | Admitting: Emergency Medicine

## 2017-04-13 ENCOUNTER — Emergency Department (HOSPITAL_COMMUNITY)
Admission: EM | Admit: 2017-04-13 | Discharge: 2017-04-13 | Disposition: A | Discharge status: Home / Self Care | Payer: Self-pay | Attending: Emergency Medicine | Admitting: Emergency Medicine

## 2017-04-13 DIAGNOSIS — E119 Type 2 diabetes mellitus without complications: Secondary | ICD-10-CM | POA: Insufficient documentation

## 2017-04-13 DIAGNOSIS — Z4802 Encounter for removal of sutures: Secondary | ICD-10-CM | POA: Insufficient documentation

## 2017-04-13 DIAGNOSIS — Z87891 Personal history of nicotine dependence: Secondary | ICD-10-CM | POA: Insufficient documentation

## 2017-04-13 NOTE — ED Notes (Signed)
12 sutures removed from right index finger. Edges well approximated.  Dressing applied with finger splint.

## 2017-04-13 NOTE — ED Triage Notes (Signed)
Pt had sutures to the right middle finger on 04/03/2017.  Here for suture removal

## 2017-04-13 NOTE — Discharge Instructions (Signed)
Finish taking your antibiotics.  Keep your finger protected, wear the splint given today.  You do need a followup with Dr. Romeo AppleHarrison as discussed given the bony injury to this finger.

## 2017-04-14 NOTE — ED Provider Notes (Signed)
[facility] Provider Note   CSN: [id number] Arrival date & time: 04/13/17  1855     History   Chief Complaint Chief Complaint  Patient presents with  . Suture / Staple Removal    HPI William [name] is a 66 y.o. male presenting for suture removal from his right long finger placed here 10 days ago. He also has an avulsion fracture of the dip joint of this finger.  Pt states no problems with pain or with moving the finger and has not followed up with orthopedics as was originally recommended.  He denies pain or drainage along the wound edges, no fevers, chills, numbness or weakness in the finger.  The history is provided by the patient.    Past Medical History:  Diagnosis Date  . Diabetes mellitus without complication (HCC)     There are no active problems to display for this patient.   Past Surgical History:  Procedure Laterality Date  . APPENDECTOMY         Home Medications    Prior to Admission medications   Medication Sig Start Date End Date Taking? Authorizing Provider  cephALEXin (KEFLEX) 500 MG capsule Take 1 capsule (500 mg total) by mouth 4 (four) times daily. For 10 days 04/03/17   Triplett, Tammy, PA-C  ibuprofen (ADVIL,MOTRIN) 800 MG tablet Take 1 tablet (800 mg total) by mouth 3 (three) times daily. 04/03/17   Pauline Ausriplett, Tammy, PA-C    Family History No family history on file.  Social History Social History  Substance Use Topics  . Smoking status: Never Smoker  . Smokeless tobacco: Never Used  . Alcohol use Yes     Allergies   Patient has no known allergies.   Review of Systems Review of Systems  Constitutional: Negative for chills and fever.  Skin: Positive for wound.  Neurological: Negative for weakness and numbness.     Physical Exam Updated Vital Signs BP (!) 164/89 (BP Location: Left Arm)   Pulse 92   Temp 98 F (36.7 C) (Oral)   Resp 18   Wt 59 kg   SpO2 100%   BMI 23.78 kg/m   Physical Exam  Constitutional: He is  oriented to person, place, and time. He appears well-developed and well-nourished.  HENT:  Head: Normocephalic.  Cardiovascular: Normal rate.   Pulmonary/Chest: Effort normal.  Musculoskeletal: He exhibits tenderness.  Neurological: He is alert and oriented to person, place, and time. No sensory deficit.  Skin: Laceration noted.  Well healing wound with mild edema of the finger, no erythema, finger is not warm to touch.  He displays FROM of the digit without pain.     ED Treatments / Results  Labs (all labs ordered are listed, but only abnormal results are displayed) Labs Reviewed - No data to display  EKG  EKG Interpretation None       Radiology No results found.  Procedures Procedures (including critical care time)  SUTURE REMOVAL Performed by: Burgess AmorIDOL, Carlyn Lemke  Consent: Verbal consent obtained. Patient identity confirmed: provided demographic data Time out: Immediately prior to procedure a "time out" was called to verify the correct patient, procedure, equipment, support staff and site/side marked as required.  Location details: right long finger  Wound Appearance: clean  Sutures/Staples Removed: 12  Facility: sutures placed in this facility Patient tolerance: Patient tolerated the procedure well with no immediate complications.     Medications Ordered in ED Medications - No data to display   Initial Impression / Assessment and Plan /  ED Course  I have reviewed the triage vital signs and the nursing notes.  Pertinent labs & imaging results that were available during my care of the patient were reviewed by me and considered in my medical decision making (see chart for details).     Pt placed in finger splint to protect the injury. Discussed importance of f/u with ortho to ensure finger heals without disability. Family at bedside states will encourage him to go. Prn f/u anticipated.  Final Clinical Impressions(s) / ED Diagnoses   Final diagnoses:  Visit  for suture removal    New Prescriptions Discharge Medication List as of 04/13/2017  7:29 PM       Burgess Amor, PA-C 04/14/17 2253    Mesner, Barbara Cower, MD 04/15/17 1142

## 2017-04-15 ENCOUNTER — Ambulatory Visit: Payer: Self-pay | Admitting: Physician Assistant

## 2017-04-15 ENCOUNTER — Encounter: Payer: Self-pay | Admitting: Physician Assistant

## 2017-04-15 ENCOUNTER — Other Ambulatory Visit (HOSPITAL_COMMUNITY)
Admission: RE | Admit: 2017-04-15 | Discharge: 2017-04-15 | Disposition: A | Discharge status: Home / Self Care | Payer: Self-pay | Source: Ambulatory Visit | Attending: Physician Assistant | Admitting: Physician Assistant

## 2017-04-15 VITALS — BP 94/48 | HR 89 | Temp 97.3°F | Ht 61.5 in | Wt 128.0 lb

## 2017-04-15 DIAGNOSIS — IMO0002 Reserved for concepts with insufficient information to code with codable children: Secondary | ICD-10-CM | POA: Insufficient documentation

## 2017-04-15 DIAGNOSIS — E1165 Type 2 diabetes mellitus with hyperglycemia: Secondary | ICD-10-CM

## 2017-04-15 DIAGNOSIS — E118 Type 2 diabetes mellitus with unspecified complications: Principal | ICD-10-CM

## 2017-04-15 DIAGNOSIS — S61212D Laceration without foreign body of right middle finger without damage to nail, subsequent encounter: Secondary | ICD-10-CM

## 2017-04-15 DIAGNOSIS — Z131 Encounter for screening for diabetes mellitus: Secondary | ICD-10-CM

## 2017-04-15 LAB — COMPREHENSIVE METABOLIC PANEL
ALBUMIN: 4.1 g/dL (ref 3.5–5.0)
ALT: 14 U/L — AB (ref 17–63)
ANION GAP: 8 (ref 5–15)
AST: 15 U/L (ref 15–41)
Alkaline Phosphatase: 159 U/L — ABNORMAL HIGH (ref 38–126)
BUN: 18 mg/dL (ref 6–20)
CHLORIDE: 97 mmol/L — AB (ref 101–111)
CO2: 30 mmol/L (ref 22–32)
CREATININE: 0.86 mg/dL (ref 0.61–1.24)
Calcium: 9.3 mg/dL (ref 8.9–10.3)
GFR calc non Af Amer: >60 mL/min (ref >60)
Glucose, Bld: 405 mg/dL — ABNORMAL HIGH (ref 65–99)
Potassium: 4.5 mmol/L (ref 3.5–5.1)
SODIUM: 135 mmol/L (ref 135–145)
Total Bilirubin: 0.5 mg/dL (ref 0.3–1.2)
Total Protein: 7.6 g/dL (ref 6.5–8.1)

## 2017-04-15 LAB — CBC
HCT: 38.8 % — ABNORMAL LOW (ref 39.0–52.0)
Hemoglobin: 13.4 g/dL (ref 13.0–17.0)
MCH: 29.8 pg (ref 26.0–34.0)
MCHC: 34.5 g/dL (ref 30.0–36.0)
MCV: 86.2 fL (ref 78.0–100.0)
PLATELETS: 285 10*3/uL (ref 150–400)
RBC: 4.5 MIL/uL (ref 4.22–5.81)
RDW: 12.5 % (ref 11.5–15.5)
WBC: 5.2 10*3/uL (ref 4.0–10.5)

## 2017-04-15 LAB — LIPID PANEL
Cholesterol: 227 mg/dL — ABNORMAL HIGH (ref 0–200)
HDL: 58 mg/dL (ref >40)
LDL Cholesterol: 134 mg/dL — ABNORMAL HIGH (ref 0–99)
TRIGLYCERIDES: 176 mg/dL — AB (ref <150)
Total CHOL/HDL Ratio: 3.9 RATIO
VLDL: 35 mg/dL (ref 0–40)

## 2017-04-15 LAB — GLUCOSE, POCT (MANUAL RESULT ENTRY): POC Glucose: 390 mg/dl — AB (ref 70–99)

## 2017-04-15 LAB — PSA: PSA: 1.81 ng/mL (ref 0.00–4.00)

## 2017-04-15 MED ORDER — INSULIN NPH ISOPHANE & REGULAR (70-30) 100 UNIT/ML ~~LOC~~ SUSP: 15.0000 [IU] | Freq: Two times a day (BID) | SUBCUTANEOUS | 0 refills | Status: DC

## 2017-04-15 MED ORDER — METFORMIN HCL 500 MG PO TABS: ORAL_TABLET | ORAL | 1 refills | Status: DC

## 2017-04-15 NOTE — Patient Instructions (Signed)
Cmo y dnde aplicar inyecciones de insulina por va subcutnea - adultos (How and Where to Give Subcutaneous Insulin Injections, Adult) Las personas con diabetes de tipo 1 deben administrarse insulina debido a que sus cuerpos no la producen. Las personas con diabetes tipo 2 pueden requerir insulina. Existen muchos tipos diferentes de [place], as como tambin otros medicamentos inyectables para la diabetes, que se deben Energy manager la capa de tejido graso que se encuentra debajo de la piel. El tipo de insulina o el medicamento inyectable para la diabetes que tome puede determinar cuntas inyecciones deber administrarse y cundo deben aplicarse. ELEGIR UNA ZONA PARA LA INYECCIN: La absorcin de insulina vara de un sitio a otro. Al igual que con cualquier medicamento inyectable, se recomienda inyectar la insulina dentro de la misma regin del cuerpo. Sin embargo, la insulina no debe inyectarse en la misma zona cada vez que se administre. Rotar las zonas para las Investment banker, corporate la inflamacin o la degradacin de los tejidos. Hay cuatro regiones principales que pueden utilizarse para las inyecciones. Las regiones incluyen:  Abdomen (regin de preferencia, especialmente para medicamentos inyectables para la diabetes diferentes de la insulina).  La parte anterior y superior externa de los muslos.  La parte posterior superior del brazo  Los glteos UTILIZACIN DE LA [place] AMPOLLA Administrar insulina: nica dosis 1. Lave sus manos con agua y [place]. 2. Haga rodar suavemente el frasco de insulina (ampolla) entre sus manos para [place]. No agite la ampolla. 3. Limpie el tapn de goma de la ampolla con un hisopo con alcohol. Asegrese de eliminar la parte plstica superior de las ampollas nuevas. 4. Retire la cubierta plstica de la aguja que est en la [place]. No deje que la aguja toque nada. 5. Tire del mbolo para extraer el aire de la [place]. Debe tener la misma cantidad de aire que  de dosis de [place]. 6. Inserte la aguja a travs de la tapa de goma de la ampolla. No d vuelta la ampolla. 7. Empuje todo el mbolo para pasar el aire a la ampolla. 8. Deje la aguja en la ampolla. Luego, d vuelta la ampolla y la [place]. 9. Tire lentamente del mbolo, para que ingrese la cantidad de insulina que necesita a Research scientist (life sciences). 10. Observe si quedaron burbujas de [employer]. Podr necesitar empujar el mbolo hacia arriba y [place] abajo 2 o 3 veces para eliminar las burbujas de aire de la [place]. 11. Tire nuevamente del mbolo para conseguir la dosis correcta. 12. Quite la aguja de la ampolla. 13. Utilice una toallita impregnada en alcohol para limpiar la zona en la que se inyectar. 14. Inyctela, aproximadamente, 1 pulgada (2,5 cm) por debajo de la superficie de la piel, y [place]. 15. Coloque la aguja derecha en la piel (en un ngulo de 90 grados). Inserte la aguja en toda su extensin (hasta la unin con la [place]). En adultos de talla pequea y que tienen poca grasa, deber inyectarla en un ngulo de 45 grados. 16. Cuando la aguja est insertada, puede soltar la piel. 17. Empuje el mbolo hasta el final para Community education officer. 18. Para extraer, tire la aguja en forma recta. 19. Presione la toallita embebida en alcohol sobre el sitio en que aplic la inyeccin. Sostngalo all por algunos segundos. No frote la zona. 20. No vuelva a colocar la cubierta plstica en la aguja. Administrar insulina: mezcla de 2 tipos de insulina 1. Lave sus manos con agua y [place]. 2. Maricela Curet rodar suavemente  el frasco (ampolla) de la insulina "turbia" entre sus manos o grelo hacia abajo para [place]. 3. Limpie la parte superior de la ampolla con un hisopo con alcohol. Asegrese de eliminar la tapa plstica superior de las ampollas nuevas. 4. Asegrese de que entre a la jeringa la misma cantidad de aire como de Guaminsulina "turbia" que necesita. 5. Coloque la aguja de la [place] en el  envase de insulina "turbia" e inyecte aire. Asegrese de que la ampolla est [place] arriba. 6. Quite la aguja de la ampolla de Guaminsulina "turbia". 7. Tire de la [place] para que entre tanto aire como la cantidad de insulina "cristalina" que necesita. 8. Coloque la aguja de la Pacific Mutualjeringa en el envase de insulina "cristalina" e inyecte aire. 9. Deje la aguja en la ampolla de insulina "cristalina" y luego pngala boca abajo. 10. Tire lentamente del mbolo para que la cantidad de insulina "cristalina" deseada ingrese a la [place]. 11. Observe si quedaron burbujas de E. I. du Pontaire en la jeringa. Podr necesitar empujar el mbolo hacia arriba y [place] abajo 2 o 3 veces para eliminar las burbujas de aire de la [place]. 12. Quite la aguja de la ampolla de insulina "cristalina". 13. Coloque la aguja en la ampolla de insulina "turbia". No inyecte la insulina "cristalina" en el frasco de la "turbia". 14. Coloque la ampolla de la insulina "turbia" Phoebe Sharpshacia abajo y tire del mbolo hasta que quede la misma cantidad total de insulina "cristalina" e insulina "turbia". 15. Quite la aguja de la ampolla de Guaminsulina "turbia". 16. Utilice una toallita impregnada en alcohol para limpiar la zona en la que se inyectar. 17. Coloque la aguja derecha en la piel (en un ngulo de 90 grados). Inserte la aguja en toda su extensin (hasta la unin con la [place]). En adultos de talla pequea y que tienen poca grasa, deber inyectarla en un ngulo de 45 grados. 18. Cuando la aguja est insertada, puede soltar la piel. 19. Empuje el mbolo hasta el final para Community education officerinyectar el medicamento. 20. Para extraer, tire la aguja en forma recta. 21. Presione la toallita embebida en alcohol sobre el sitio en que aplic la inyeccin. Sostngalo all por algunos segundos. No frote la zona. 22. No vuelva a colocar la cubierta plstica en la aguja. UTILIZACIN DE LAPICERAS DE INSULINA 1. Lave sus manos con agua y [place]. 2. Si est utilizando insulina "turbia" haga  rodar la lapicera entre sus manos varias veces o grela de arriba [place] abajo. 3. Retire la tapa de la lapicera de insulina. 4. Limpie el tapn de goma del cartucho con una toallita impregnada en alcohol. 5. Quite la etiqueta protectora de la aguja desechable. 6. Enrosque la aguja en la lapicera. 7. Retire la cubierta plstica externa protectora de la aguja. 8. Retire la cubierta plstica interna protectora de la aguja. 9. Prepare la lapicera de insulina colocando el botn (dial) a 2 unidades. Sostenga la lapicera apuntando hacia arriba y empuje el dial hasta que aparezca una gota de insulina en la punta de la aguja. Si esto no ocurre, Paediatric nurserepita este paso. 10. Coloque en el dial el nmero de unidades de insulina que [place]iainyectar. 11. Utilice una toallita impregnada en alcohol para limpiar la zona en la que se inyectar. 12. Inyctela, aproximadamente, 1 pulgada (2,5 cm) por debajo de la superficie de la piel, y [place]. 13. Coloque la aguja derecha en la piel (en un ngulo de 90 grados). 14. Empuje el botn (dial) hacia abajo para que la insulina ingrese en el tejido Prospergraso.  15. Cuente hasta 10 lentamente. Luego quite la [place] del tejido Journalist, newspaper. 16. Coloque la cubierta plstica externa en la aguja y desenrsquela. COMO DESECHAR LOS ELEMENTOS  Deseche las jeringas usadas en un recipiente especial para objetos cortopunzantes. Siga las indicaciones de las normas de la zona en que usted vive.  Las [employer] y las lapiceras desechables pueden arrojarse en un cesto de basura comn. Esta informacin no tiene Theme park manager el consejo del mdico. Asegrese de hacerle al mdico cualquier pregunta que tenga. Document Released: 11/23/2005 Document Revised: 03/16/2016 Document Reviewed: 05/02/2013 Elsevier Interactive Patient Education  2017 [employer].

## 2017-04-15 NOTE — Progress Notes (Signed)
BP (!) 94/48 (BP Location: Left Arm, Patient Position: Sitting, Cuff Size: Normal)   Pulse 89   Temp 97.3 F (36.3 C) (Other (Comment))   Ht 5' 1.5" (1.562 m)   Wt 128 lb (58.1 kg)   SpO2 98%   BMI 23.79 kg/m    Subjective:    Patient ID: William [name], male    DOB: 03/21/1951, 66 y.o.   MRN: [id number]  HPI: William [name] is a 66 y.o. male presenting on 04/15/2017 for New Patient (Initial Visit)   HPI   Pt seen here in the past.  Last time was 05/13/2015.  Pt was chronically non-compliant diabetic.   Pt was seen in ER recently for finger injury and was recommended to re-establish primary care  Pt says he hasn't felt too well lately.  He has lost 12 pounds since he was seen here June 2016  Pt says he finished all of the antibiotics that he was given for his finger laceration.  He says it is doing okay.  No drainage, redness or worsening pain.   Relevant past medical, surgical, family and social history reviewed and updated as indicated. Interim medical history since our last visit reviewed. Allergies and medications reviewed and updated.  No current outpatient prescriptions on file.   Review of Systems  Constitutional: Positive for appetite change and unexpected weight change. Negative for chills, diaphoresis, fatigue and fever.  HENT: Negative for congestion, drooling, ear pain, facial swelling, hearing loss, mouth sores, sneezing, sore throat, trouble swallowing and voice change.   Eyes: Negative for pain, discharge, redness, itching and visual disturbance.  Respiratory: Negative for cough, choking, shortness of breath and wheezing.   Cardiovascular: Negative for chest pain, palpitations and leg swelling.  Gastrointestinal: Negative for abdominal pain, blood in stool, constipation, diarrhea and vomiting.  Endocrine: Negative for cold intolerance, heat intolerance and polydipsia.  Genitourinary: Negative for decreased urine volume, dysuria and hematuria.   Musculoskeletal: Negative for arthralgias, back pain and gait problem.  Skin: Negative for rash.  Allergic/Immunologic: Negative for environmental allergies.  Neurological: Positive for light-headedness. Negative for seizures, syncope and headaches.  Hematological: Negative for adenopathy.  Psychiatric/Behavioral: Positive for dysphoric mood. Negative for agitation and suicidal ideas. The patient is not nervous/anxious.     Per HPI unless specifically indicated above     Objective:    BP (!) 94/48 (BP Location: Left Arm, Patient Position: Sitting, Cuff Size: Normal)   Pulse 89   Temp 97.3 F (36.3 C) (Other (Comment))   Ht 5' 1.5" (1.562 m)   Wt 128 lb (58.1 kg)   SpO2 98%   BMI 23.79 kg/m   Wt Readings from Last 3 Encounters:  04/15/17 128 lb (58.1 kg)  04/13/17 130 lb (59 kg)  04/05/17 130 lb (59 kg)    Physical Exam  Constitutional: He is oriented to person, place, and time. He appears well-developed and well-nourished.  HENT:  Head: Normocephalic and atraumatic.  Mouth/Throat: Oropharynx is clear and moist. No oropharyngeal exudate.  Eyes: Conjunctivae and EOM are normal. Pupils are equal, round, and reactive to light.  Neck: Neck supple. No thyromegaly present.  Cardiovascular: Normal rate and regular rhythm.   Pulmonary/Chest: Effort normal and breath sounds normal. He has no wheezes. He has no rales.  Abdominal: Soft. Bowel sounds are normal. He exhibits no mass. There is no hepatosplenomegaly. There is no tenderness.  Musculoskeletal: He exhibits no edema.       Right hand: He exhibits laceration.  R middle  finger laceration healing. No redness or discharge.  Appears to be able to bend all joints but ROM limited possible due to swelling.  Good sensation.   Lymphadenopathy:    He has no cervical adenopathy.  Neurological: He is alert and oriented to person, place, and time.  Skin: Skin is warm and dry. No rash noted.  Psychiatric: He has a normal mood and affect.  His behavior is normal. Thought content normal.  Vitals reviewed.   Results for orders placed or performed in visit on 04/15/17  POCT Glucose (CBG)  Result Value Ref Range   POC Glucose 390 (A) 70 - 99 mg/dl      Assessment & Plan:    Encounter Diagnoses  Name Primary?  Marland Kitchen. Uncontrolled type 2 diabetes mellitus with complication, unspecified whether long term insulin use (HCC) Yes  . Laceration of right middle finger without foreign body without damage to nail, subsequent encounter   . Screening for diabetes mellitus      -Pt to get baseline labs drawn today when leaves office.  He is given Rx for Metformin and sample novolin 70/30-  To take 15 u bid.  Pt is instructed on proper technique for administering the insulin.  Pt is to monitor the bs and bring log to follow up appointment.  He is  reminded to call clinic for fbs > 300 or < 70.   -follow up 2 weeks with bs log. RTO sooner prn

## 2017-04-16 ENCOUNTER — Encounter: Payer: Self-pay | Admitting: Orthopedic Surgery

## 2017-04-16 ENCOUNTER — Ambulatory Visit (INDEPENDENT_AMBULATORY_CARE_PROVIDER_SITE_OTHER): Payer: Self-pay | Admitting: Orthopedic Surgery

## 2017-04-16 VITALS — BP 94/56 | HR 88 | Ht 62.0 in | Wt 121.0 lb

## 2017-04-16 DIAGNOSIS — S6991XA Unspecified injury of right wrist, hand and finger(s), initial encounter: Secondary | ICD-10-CM

## 2017-04-16 DIAGNOSIS — S61312A Laceration without foreign body of right middle finger with damage to nail, initial encounter: Secondary | ICD-10-CM

## 2017-04-16 LAB — C-PEPTIDE: C PEPTIDE: 2.9 ng/mL (ref 1.1–4.4)

## 2017-04-16 LAB — HEMOGLOBIN A1C
Hgb A1c MFr Bld: 13.5 % — ABNORMAL HIGH (ref 4.8–5.6)
MEAN PLASMA GLUCOSE: 341 mg/dL

## 2017-04-16 LAB — MICROALBUMIN, URINE: Microalb, Ur: 46.4 ug/mL — ABNORMAL HIGH

## 2017-04-16 NOTE — Progress Notes (Signed)
  NEW PATIENT OFFICE VISIT    Chief Complaint  Patient presents with  . Finger Injury    right middle finger with laceration on 04/03/17    66 year old male presents with Spanish interpreter. Apparently he injured his right long finger in a tire machine. He was treated in the emergency room with irrigation debridement wound closure. His x-ray do show some avulsion fractures of the distal phalanx and nondisplaced and questionable in terms of etiology and timing of those injuries  He has no complaints at this time.   Review of Systems  Neurological: Negative for tingling.     Past Medical History:  Diagnosis Date  . Diabetes mellitus without complication (HCC)   . Hyperlipidemia     Past Surgical History:  Procedure Laterality Date  . APPENDECTOMY      Family History  Problem Relation Age of Onset  . Diabetes Mother   . Diabetes Father    Social History  Substance Use Topics  . Smoking status: Former Smoker    Quit date: 12/07/1996  . Smokeless tobacco: Never Used  . Alcohol use No    BP (!) 94/56   Pulse 88   Ht 5\' 2"  (1.575 m)   Wt 121 lb (54.9 kg)   BMI 22.13 kg/m   Physical Exam  Constitutional: He appears well-developed and well-nourished.  Vital signs have been reviewed and are stable. Gen. appearance the patient is well-developed and well-nourished with normal grooming and hygiene. The patient is oriented 3 with normal mood and affect.  Vitals reviewed.   Ortho Exam Right and left hand are compared. The right long finger is slightly swollen he has a laceration which looks like a fish mouth type laceration starting on the radial side of the digit and extending up ulnarly. There are no signs of infection he has near full range of motion of all digits.  No numbness or tingling normal capillary refill No orders of the defined types were placed in this encounter.   Encounter Diagnosis  Name Primary?  . Injury of finger of right hand, initial encounter  Yes    I reviewed his x-ray my interpretation is that there is a dorsal avulsion fracture of the distal phalanx of the right long finger there is also a small bony ossicle on the volar side near the distal end of the middle phalanx. Acuteness of these injuries is questionable. PLAN:   Patient returned to normal activities I don't anticipate any long-term problems

## 2017-04-29 ENCOUNTER — Other Ambulatory Visit: Payer: Self-pay | Admitting: Physician Assistant

## 2017-04-29 ENCOUNTER — Encounter: Payer: Self-pay | Admitting: Physician Assistant

## 2017-04-29 ENCOUNTER — Ambulatory Visit: Payer: Self-pay | Admitting: Physician Assistant

## 2017-04-29 VITALS — BP 134/70 | HR 85 | Temp 97.7°F | Ht 62.0 in | Wt 137.0 lb

## 2017-04-29 DIAGNOSIS — R945 Abnormal results of liver function studies: Secondary | ICD-10-CM

## 2017-04-29 DIAGNOSIS — Z1211 Encounter for screening for malignant neoplasm of colon: Secondary | ICD-10-CM

## 2017-04-29 DIAGNOSIS — R7989 Other specified abnormal findings of blood chemistry: Secondary | ICD-10-CM | POA: Insufficient documentation

## 2017-04-29 DIAGNOSIS — E785 Hyperlipidemia, unspecified: Secondary | ICD-10-CM | POA: Insufficient documentation

## 2017-04-29 DIAGNOSIS — E118 Type 2 diabetes mellitus with unspecified complications: Principal | ICD-10-CM

## 2017-04-29 DIAGNOSIS — E1165 Type 2 diabetes mellitus with hyperglycemia: Secondary | ICD-10-CM

## 2017-04-29 MED ORDER — METFORMIN HCL 1000 MG PO TABS: ORAL_TABLET | ORAL | 3 refills | Status: DC

## 2017-04-29 MED ORDER — LOVASTATIN 20 MG PO TABS: ORAL_TABLET | ORAL | 4 refills | Status: DC

## 2017-04-29 NOTE — Patient Instructions (Signed)
Dieta restringida en grasas y colesterol (Fat and Cholesterol Restricted Diet) El exceso de grasas y colesterol en la dieta puede causar problemas de salud. Esta dieta lo ayudar a Pharmacologistmantener las grasas y Print production plannerel colesterol en los niveles normales para evitar enfermarse. QU TIPOS DE GRASAS DEBO ELEGIR?  Elija grasas monosaturadas y polinsaturadas. Estas se encuentran en alimentos como el aceite de oliva, aceite de canola, semillas de lino, nueces, almendras y semillas.  Consuma ms grasas omega-3. Las mejores opciones incluyen salmn, caballa, sardinas, atn, aceite de lino y semillas de lino molidas.  Limite el consumo de grasas saturadas, que se encuentran en productos de origen animal, como carnes, mantequilla y crema. Tambin pueden estar en productos vegetales, como aceite de palma, de palmiste y de coco.  Evite los alimentos con aceites parcialmente hidrogenados. Estos contienen grasas trans. Entre los ejemplos de alimentos con grasas trans se incluyen margarinas en barra, algunas margarinas untables, galletas dulces o saladas y otros productos horneados. QU PAUTAS GENERALES DEBO SEGUIR?  Lea las etiquetas de los alimentos. Busque las palabras "grasas trans" y "grasas saturadas".  Al preparar una comida:  Llene la mitad del plato con verduras y ensaladas de hojas verdes.  Llene un cuarto del plato con cereales integrales. Busque la palabra "integral" en Estate agentel primer lugar de la lista de ingredientes.  Llene un cuarto del plato con alimentos con protenas magras.  Ingiera alimentos con ms fibra, como Moultonmanzanas, Doonzanahorias, frijoles, guisantes y Qatarcebada.  Coma ms comidas caseras. Coma menos en los restaurantes y los bares.  Limite o evite el alcohol.  Limite los alimentos con alto contenido de almidn y International aid/development workerazcar.  Limite el consumo de alimentos fritos.  Cocine los alimentos sin frerlos. Las opciones de coccin ms Panamaadecuadas son Development worker, communityhornear, Regulatory affairs officerhervir, Software engineergrillar y asar a Patent attorneyla parrilla.  Baje de  peso si es necesario. Aunque pierda Marshall & Ilsleypoco peso, esto puede ser importante para la salud general. Tambin puede ayudar a prevenir enfermedades como diabetes y enfermedad cardaca. QU ALIMENTOS PUEDO COMER? Cereales Cereales integrales, como los panes de salvado o Aguadaintegrales, las Ardencroftgalletas, los cereales y las pastas. Avena sin endulzar, trigo, Qatarcebada, quinua o arroz integral. Tortillas de harina de maz o de salvado. Verduras Verduras frescas o congeladas (crudas, al vapor, asadas o grilladas). Ensaladas de hojas verdes. Nils PyleFrutas Nils PyleFrutas frescas, en conserva (en su jugo natural) o frutas congeladas. Carnes y otros productos con protenas Carne de res molida (al 85% o ms San Marinomagra), carne de res de animales alimentados con pastos o carne de res sin la grasa. Pollo o pavo sin piel. Carne de pollo o de Chardonpavo molida. Cerdo sin la grasa. Todos los pescados y frutos de mar. Huevos. Porotos, guisantes o lentejas secos. Frutos secos o semillas sin sal. Frijoles secos o en lata sin sal. Lcteos Productos lcteos con bajo contenido de grasas, como Vanceboroleche descremada o al 1%, quesos reducidos en grasas o al 2%, ricota con bajo contenido de grasas o Leggett & Plattqueso cottage, o yogur natural con bajo contenido de Culpgrasas. Grasas y Hershey Companyaceites Margarinas untables que no contengan grasas trans. Mayonesa y condimentos para ensaladas livianos o reducidos en grasas. Aguac[place]. Aceites de oliva, canola, ssamo o crtamo. Mantequilla natural de cacahuate o almendra (elija la que no tenga agregado de aceite o azcar). Los artculos mencionados arriba pueden no ser Raytheonuna lista completa de las bebidas o los alimentos recomendados. Comunquese con el nutricionista para conocer ms opciones. QU ALIMENTOS NO SE RECOMIENDAN? Cereales Pan blanco. Pastas blancas. Arroz blanco. Pan de maz. Bagels,  pasteles y croissants. Galletas saladas que contengan grasas trans. Verduras Papas blancas. MazHoover Brunette. Verduras con crema o fritas. Verduras en salsa de  Grenvillequeso. Nils PyleFrutas Frutas secas. Fruta enlatada en almbar liviano o espeso. Jugo de frutas. Carnes y otros productos con protenas Cortes de carne con Holiday representativegrasa. Costillas, alas de pollo, tocineta, salchicha, mortadela, salame, chinchulines, tocino, perros calientes, salchichas alemanas y embutidos envasados. Hgado y otros rganos. Lcteos Leche entera o al 2%, crema, mezcla de Augustaleche y crema, y queso crema. Quesos enteros. Yogur entero o endulzado. Quesos con toda su grasa. Cremas no lcteas y coberturas batidas. Quesos procesados, quesos para untar o cuajadas. Dulces y postres Jarabe de maz, azcares, miel y Radio broadcast assistantmelazas. Caramelos. Mermelada y Kazakhstanjalea. Doreen BeamJarabe. Cereales endulzados. Galletas, pasteles, bizcochuelos, donas, muffins y helado. Grasas y [address] Mainaceites Mantequilla, Indiamargarina en barra, Garden Citymanteca de Westmontcerdo, Drowning Creekgrasa, Singaporemantequilla clarificada o grasa de tocino. Aceites de coco, de palmiste o de palma. Bebidas Alcohol. Bebidas endulzadas (como refrescos, limonadas y bebidas frutales o ponches). Los artculos mencionados arriba pueden no ser Raytheonuna lista completa de las bebidas y los alimentos que se Theatre stage managerdeben evitar. Comunquese con el nutricionista para obtener ms informacin. Esta informacin no tiene Theme park managercomo fin reemplazar el consejo del mdico. Asegrese de hacerle al mdico cualquier pregunta que tenga. Document Released: 05/24/2012 Document Revised: 12/14/2014 Document Reviewed: 02/22/2014 Elsevier Interactive Patient Education  2017 Arvin[employer]Elsevier [employer]

## 2017-04-29 NOTE — Progress Notes (Signed)
BP 134/70 (BP Location: Left Arm, Patient Position: Sitting, Cuff Size: Normal)   Pulse 85   Temp 97.7 F (36.5 C)   Ht 5\' 2"  (1.575 m)   Wt 137 lb (62.1 kg)   SpO2 98%   BMI 25.06 kg/m    Subjective:    Patient ID: William [name], male    DOB: 05-05-1951, 66 y.o.   MRN: [id number]  HPI: William CarrelSantos Rigdon is a 66 y.o. male presenting on 04/29/2017 for Diabetes   HPI   Pt checked his bs some. Last time was Sunday and it was 180.    Pt is feeling better now that he is back on dm meds  Pt is happy that his finger is improving  Relevant past medical, surgical, family and social history reviewed and updated as indicated. Interim medical history since our last visit reviewed. Allergies and medications reviewed and updated.   Current Outpatient Prescriptions:  .  insulin NPH-regular Human (NOVOLIN 70/30) (70-30) 100 UNIT/ML injection, Inject 15 Units into the skin 2 (two) times daily with a meal., Disp: 10 mL, Rfl: 0 .  metFORMIN (GLUCOPHAGE) 500 MG tablet, 1 po bid with meal.  Tome una tableta por boca dos veces diarias con comida, Disp: 60 tablet, Rfl: 1   Review of Systems  Constitutional: Positive for appetite change and fatigue. Negative for chills, diaphoresis, fever and unexpected weight change.  HENT: Positive for dental problem. Negative for congestion, drooling, ear pain, facial swelling, hearing loss, mouth sores, sneezing, sore throat, trouble swallowing and voice change.   Eyes: Negative for pain, discharge, redness, itching and visual disturbance.  Respiratory: Negative for cough, choking, shortness of breath and wheezing.   Cardiovascular: Negative for chest pain, palpitations and leg swelling.  Gastrointestinal: Negative for abdominal pain, blood in stool, constipation, diarrhea and vomiting.  Endocrine: Negative for cold intolerance, heat intolerance and polydipsia.  Genitourinary: Negative for decreased urine volume, dysuria and hematuria.  Musculoskeletal:  Negative for arthralgias, back pain and gait problem.  Skin: Negative for rash.  Allergic/Immunologic: Negative for environmental allergies.  Neurological: Negative for seizures, syncope, light-headedness and headaches.  Hematological: Negative for adenopathy.  Psychiatric/Behavioral: Negative for agitation, dysphoric mood and suicidal ideas. The patient is not nervous/anxious.     Per HPI unless specifically indicated above     Objective:    BP 134/70 (BP Location: Left Arm, Patient Position: Sitting, Cuff Size: Normal)   Pulse 85   Temp 97.7 F (36.5 C)   Ht 5\' 2"  (1.575 m)   Wt 137 lb (62.1 kg)   SpO2 98%   BMI 25.06 kg/m   Wt Readings from Last 3 Encounters:  04/29/17 137 lb (62.1 kg)  04/16/17 121 lb (54.9 kg)  04/15/17 128 lb (58.1 kg)    Physical Exam  Constitutional: He is oriented to person, place, and time. He appears well-developed and well-nourished.  HENT:  Head: Normocephalic and atraumatic.  Neck: Neck supple.  Cardiovascular: Normal rate and regular rhythm.   Pulmonary/Chest: Effort normal and breath sounds normal. He has no wheezes.  Abdominal: Soft. Bowel sounds are normal. There is no hepatosplenomegaly. There is no tenderness.  Musculoskeletal: He exhibits no edema.  R 3rd finger healing. No open wound. No redness or signs infection.   Lymphadenopathy:    He has no cervical adenopathy.  Neurological: He is alert and oriented to person, place, and time.  Skin: Skin is warm and dry.  Psychiatric: He has a normal mood and affect. His behavior is  normal.  Vitals reviewed.   Results for orders placed or performed during the hospital encounter of 04/15/17  PSA  Result Value Ref Range   PSA 1.81 0.00 - 4.00 ng/mL  CBC  Result Value Ref Range   WBC 5.2 4.0 - 10.5 K/uL   RBC 4.50 4.22 - 5.81 MIL/uL   Hemoglobin 13.4 13.0 - 17.0 g/dL   HCT 16.1 (L) 09.6 - 04.5 %   MCV 86.2 78.0 - 100.0 fL   MCH 29.8 26.0 - 34.0 pg   MCHC 34.5 30.0 - 36.0 g/dL   RDW  40.9 81.1 - 91.4 %   Platelets 285 150 - 400 K/uL  Comprehensive metabolic panel  Result Value Ref Range   Sodium 135 135 - 145 mmol/L   Potassium 4.5 3.5 - 5.1 mmol/L   Chloride 97 (L) 101 - 111 mmol/L   CO2 30 22 - 32 mmol/L   Glucose, Bld 405 (H) 65 - 99 mg/dL   BUN 18 6 - 20 mg/dL   Creatinine, Ser 7.82 0.61 - 1.24 mg/dL   Calcium 9.3 8.9 - 95.6 mg/dL   Total Protein 7.6 6.5 - 8.1 g/dL   Albumin 4.1 3.5 - 5.0 g/dL   AST 15 15 - 41 U/L   ALT 14 (L) 17 - 63 U/L   Alkaline Phosphatase 159 (H) 38 - 126 U/L   Total Bilirubin 0.5 0.3 - 1.2 mg/dL   GFR calc non Af Amer >60 >60 mL/min   GFR calc Af Amer >60 >60 mL/min   Anion gap 8 5 - 15  Lipid panel  Result Value Ref Range   Cholesterol 227 (H) 0 - 200 mg/dL   Triglycerides 213 (H) <150 mg/dL   HDL 58 >08 mg/dL   Total CHOL/HDL Ratio 3.9 RATIO   VLDL 35 0 - 40 mg/dL   LDL Cholesterol 657 (H) 0 - 99 mg/dL  Hemoglobin Q4O  Result Value Ref Range   Hgb A1c MFr Bld 13.5 (H) 4.8 - 5.6 %   Mean Plasma Glucose 341 mg/dL  C-peptide  Result Value Ref Range   C-Peptide 2.9 1.1 - 4.4 ng/mL  Microalbumin, urine  Result Value Ref Range   Microalb, Ur 46.4 (H) Not Estab. ug/mL      Assessment & Plan:   Encounter Diagnoses  Name Primary?  Marland Kitchen Uncontrolled type 2 diabetes mellitus with complication, unspecified whether long term insulin use (HCC) Yes  . Elevated LFTs   . Hyperlipidemia, unspecified hyperlipidemia type   . Screening for colon cancer     -reviewed labs with pt -Increase insulin to 20u bid.increase  Metformin to 1g bid.  Pt reminded to call office for fbs > 300 or < 70 -Add lovastatin. Counseled on lowfat diet and gave handout -will Monitor LFTs -will refer pt for Dm eye exam -Gave iFOBT for colon cancer screening -pt to follow up with bs log in one month. RTO sooner prn

## 2017-06-01 ENCOUNTER — Ambulatory Visit: Payer: Self-pay | Admitting: Physician Assistant

## 2017-06-01 ENCOUNTER — Encounter: Payer: Self-pay | Admitting: Physician Assistant

## 2017-06-01 VITALS — BP 130/70 | HR 79 | Temp 97.5°F | Ht 62.0 in | Wt 136.5 lb

## 2017-06-01 DIAGNOSIS — R7989 Other specified abnormal findings of blood chemistry: Secondary | ICD-10-CM

## 2017-06-01 DIAGNOSIS — E118 Type 2 diabetes mellitus with unspecified complications: Principal | ICD-10-CM

## 2017-06-01 DIAGNOSIS — R945 Abnormal results of liver function studies: Secondary | ICD-10-CM

## 2017-06-01 DIAGNOSIS — E785 Hyperlipidemia, unspecified: Secondary | ICD-10-CM

## 2017-06-01 DIAGNOSIS — E1165 Type 2 diabetes mellitus with hyperglycemia: Secondary | ICD-10-CM

## 2017-06-01 MED ORDER — LOVASTATIN 20 MG PO TABS: ORAL_TABLET | ORAL | 4 refills | Status: DC

## 2017-06-01 NOTE — Progress Notes (Signed)
BP 130/70 (BP Location: Left Arm, Patient Position: Sitting, Cuff Size: Normal)   Pulse 79   Temp 97.5 F (36.4 C)   Ht 5\' 2"  (1.575 m)   Wt 136 lb 8 oz (61.9 kg)   SpO2 99%   BMI 24.97 kg/m    Subjective:    Patient ID: William [name], male    DOB: [birth year], 66 y.o.   MRN: [id number]  HPI: William [name] is a 66 y.o. male presenting on 06/01/2017 for Diabetes   HPI   Pt not taking his lovastatin. He says [facility] didn't have it  Pt is feeling much better on his medicines.    Pt has bs log- am range 180-255.  Pm range 180-250  Pt brought in his iFOBT test this morning  Relevant past medical, surgical, family and social history reviewed and updated as indicated. Interim medical history since our last visit reviewed. Allergies and medications reviewed and updated.   Current Outpatient Prescriptions:  .  insulin NPH-regular Human (NOVOLIN 70/30) (70-30) 100 UNIT/ML injection, Inject 15 Units into the skin 2 (two) times daily with a meal. (Patient taking differently: Inject 20 Units into the skin 2 (two) times daily with a meal. ), Disp: 10 mL, Rfl: 0 .  metFORMIN (GLUCOPHAGE) 1000 MG tablet, 1 po bid.  Tome una tableta por boca dos veces diarias con comida, Disp: 60 tablet, Rfl: 3 .  lovastatin (MEVACOR) 20 MG tablet, 1 po qhs.  Tome una tableta por boca al dormir (Patient not taking: Reported on 06/01/2017), Disp: 30 tablet, Rfl: 4     Review of Systems  Constitutional: Negative for appetite change, chills, diaphoresis, fatigue, fever and unexpected weight change.  HENT: Negative for congestion, dental problem, drooling, ear pain, facial swelling, hearing loss, mouth sores, sneezing, sore throat, trouble swallowing and voice change.   Eyes: Negative for pain, discharge, redness, itching and visual disturbance.  Respiratory: Negative for cough, choking, shortness of breath and wheezing.   Cardiovascular: Negative for chest pain, palpitations and leg swelling.   Gastrointestinal: Negative for abdominal pain, blood in stool, constipation, diarrhea and vomiting.  Endocrine: Negative for cold intolerance, heat intolerance and polydipsia.  Genitourinary: Negative for decreased urine volume, dysuria and hematuria.  Musculoskeletal: Negative for arthralgias, back pain and gait problem.  Skin: Negative for rash.  Allergic/Immunologic: Negative for environmental allergies.  Neurological: Negative for seizures, syncope, light-headedness and headaches.  Hematological: Negative for adenopathy.  Psychiatric/Behavioral: Negative for agitation, dysphoric mood and suicidal ideas. The patient is not nervous/anxious.     Per HPI unless specifically indicated above     Objective:    BP 130/70 (BP Location: Left Arm, Patient Position: Sitting, Cuff Size: Normal)   Pulse 79   Temp 97.5 F (36.4 C)   Ht 5\' 2"  (1.575 m)   Wt 136 lb 8 oz (61.9 kg)   SpO2 99%   BMI 24.97 kg/m   Wt Readings from Last 3 Encounters:  06/01/17 136 lb 8 oz (61.9 kg)  04/29/17 137 lb (62.1 kg)  04/16/17 121 lb (54.9 kg)    Physical Exam  Constitutional: He is oriented to person, place, and time. He appears well-developed and well-nourished.  HENT:  Head: Normocephalic and atraumatic.  Neck: Neck supple.  Cardiovascular: Normal rate and regular rhythm.   Pulmonary/Chest: Effort normal and breath sounds normal. He has no wheezes.  Abdominal: Soft. Bowel sounds are normal. There is no hepatosplenomegaly. There is no tenderness.  Musculoskeletal: He exhibits no edema.  Lymphadenopathy:  He has no cervical adenopathy.  Neurological: He is alert and oriented to person, place, and time.  Skin: Skin is warm and dry.  Psychiatric: He has a normal mood and affect. His behavior is normal.  Vitals reviewed.       Assessment & Plan:   Encounter Diagnoses  Name Primary?  Marland Kitchen Uncontrolled type 2 diabetes mellitus with complication, unspecified whether long term insulin use (HCC)  Yes  . Hyperlipidemia, unspecified hyperlipidemia type   . Elevated LFTs     -pt to Increase insulin 22u bid -Lovastatin rx sent to [facility] -follow up appointment in 2 months. RTO sooner prn

## 2017-06-07 ENCOUNTER — Telehealth: Payer: Self-pay | Admitting: Student

## 2017-06-07 NOTE — Telephone Encounter (Signed)
Pt notified that colon cancer screening test is invalid due to too much stool. Pt agrees to repeat test and pay $10 for another. Pt states he will come to pick up another test kit tomorrow (06-08-17).

## 2017-06-07 NOTE — Telephone Encounter (Signed)
-----   Message from Jacquelin HawkingShannon McElroy, [place] JerseyPA-C sent at 06/03/2017  1:44 PM EDT ----- Please let pt know that his colon cancer test is invalid (had too much stool).

## 2017-06-17 LAB — IFOBT (OCCULT BLOOD): IMMUNOLOGICAL FECAL OCCULT BLOOD TEST: NEGATIVE

## 2017-08-02 ENCOUNTER — Ambulatory Visit: Payer: Self-pay | Admitting: Physician Assistant

## 2017-08-12 ENCOUNTER — Ambulatory Visit: Payer: Self-pay | Admitting: Physician Assistant

## 2017-08-12 ENCOUNTER — Other Ambulatory Visit (HOSPITAL_COMMUNITY)
Admission: RE | Admit: 2017-08-12 | Discharge: 2017-08-12 | Disposition: A | Discharge status: Home / Self Care | Payer: Self-pay | Source: Ambulatory Visit | Attending: Physician Assistant | Admitting: Physician Assistant

## 2017-08-12 ENCOUNTER — Encounter: Payer: Self-pay | Admitting: Physician Assistant

## 2017-08-12 VITALS — BP 116/66 | HR 81 | Temp 97.3°F | Ht 62.0 in | Wt 136.5 lb

## 2017-08-12 DIAGNOSIS — E785 Hyperlipidemia, unspecified: Secondary | ICD-10-CM

## 2017-08-12 DIAGNOSIS — E118 Type 2 diabetes mellitus with unspecified complications: Secondary | ICD-10-CM | POA: Insufficient documentation

## 2017-08-12 DIAGNOSIS — R945 Abnormal results of liver function studies: Secondary | ICD-10-CM

## 2017-08-12 DIAGNOSIS — R7989 Other specified abnormal findings of blood chemistry: Secondary | ICD-10-CM

## 2017-08-12 DIAGNOSIS — E1165 Type 2 diabetes mellitus with hyperglycemia: Secondary | ICD-10-CM

## 2017-08-12 LAB — HEMOGLOBIN A1C
HEMOGLOBIN A1C: 7 % — AB (ref 4.8–5.6)
MEAN PLASMA GLUCOSE: 154.2 mg/dL

## 2017-08-12 LAB — COMPREHENSIVE METABOLIC PANEL
ALT: 19 U/L (ref 17–63)
AST: 20 U/L (ref 15–41)
Albumin: 4.4 g/dL (ref 3.5–5.0)
Alkaline Phosphatase: 99 U/L (ref 38–126)
Anion gap: 7 (ref 5–15)
BUN: 21 mg/dL — ABNORMAL HIGH (ref 6–20)
CHLORIDE: 101 mmol/L (ref 101–111)
CO2: 30 mmol/L (ref 22–32)
Calcium: 9.5 mg/dL (ref 8.9–10.3)
Creatinine, Ser: 0.93 mg/dL (ref 0.61–1.24)
Glucose, Bld: 162 mg/dL — ABNORMAL HIGH (ref 65–99)
POTASSIUM: 5.3 mmol/L — AB (ref 3.5–5.1)
SODIUM: 138 mmol/L (ref 135–145)
Total Bilirubin: 0.7 mg/dL (ref 0.3–1.2)
Total Protein: 7.8 g/dL (ref 6.5–8.1)

## 2017-08-12 LAB — LIPID PANEL
Cholesterol: 234 mg/dL — ABNORMAL HIGH (ref 0–200)
HDL: 61 mg/dL (ref >40)
LDL Cholesterol: 148 mg/dL — ABNORMAL HIGH (ref 0–99)
TRIGLYCERIDES: 123 mg/dL (ref <150)
Total CHOL/HDL Ratio: 3.8 RATIO
VLDL: 25 mg/dL (ref 0–40)

## 2017-08-12 MED ORDER — NYSTATIN 100000 UNIT/GM EX CREA: TOPICAL_CREAM | CUTANEOUS | 0 refills | Status: AC

## 2017-08-12 NOTE — Progress Notes (Signed)
BP 116/66 (BP Location: Left Arm, Patient Position: Sitting, Cuff Size: Normal)   Pulse 81   Temp (!) 97.3 F (36.3 C)   Ht  (1.575 m)   Wt 136 lb 8 oz (61.9 kg)   SpO2 99%   BMI 24.97 kg/m    Subjective:    Patient ID: William [name], male    DOB: 1951/08/05, 66 y.o.   MRN: [id number]  HPI: William [name] is a 66 y.o. male presenting on 08/12/2017 for Diabetes and Hyperlipidemia   HPI   Pt did not get his labs drawn- his phone is broken so we couldn't contact  Pt never checks his bs. .  He says he has a hard time getting blood to come out his finger  He is feeling very good.  He is exercising some and taking all his medicines.   Relevant past medical, surgical, family and social history reviewed and updated as indicated. Interim medical history since our last visit reviewed. Allergies and medications reviewed and updated.   Current Outpatient Prescriptions:  .  insulin NPH-regular Human (NOVOLIN 70/30) (70-30) 100 UNIT/ML injection, Inject 15 Units into the skin 2 (two) times daily with a meal. (Patient taking differently: Inject 20 Units into the skin 2 (two) times daily with a meal. ), Disp: 10 mL, Rfl: 0 .  lovastatin (MEVACOR) 20 MG tablet, 1 po qhs.  Tome una tableta por boca al dormir, Disp: 30 tablet, Rfl: 4 .  metFORMIN (GLUCOPHAGE) 1000 MG tablet, 1 po bid.  Tome una tableta por boca dos veces diarias con comida, Disp: 60 tablet, Rfl: 3  Review of Systems  Constitutional: Negative for appetite change, chills, diaphoresis, fatigue, fever and unexpected weight change.  HENT: Negative for congestion, dental problem, drooling, ear pain, facial swelling, hearing loss, mouth sores, sneezing, sore throat, trouble swallowing and voice change.   Eyes: Negative for pain, discharge, redness, itching and visual disturbance.  Respiratory: Negative for cough, choking, shortness of breath and wheezing.   Cardiovascular: Negative for chest pain, palpitations and leg  swelling.  Gastrointestinal: Negative for abdominal pain, blood in stool, constipation, diarrhea and vomiting.  Endocrine: Negative for cold intolerance, heat intolerance and polydipsia.  Genitourinary: Negative for decreased urine volume, dysuria and hematuria.  Musculoskeletal: Negative for arthralgias, back pain and gait problem.  Skin: Negative for rash.  Allergic/Immunologic: Negative for environmental allergies.  Neurological: Negative for seizures, syncope, light-headedness and headaches.  Hematological: Negative for adenopathy.  Psychiatric/Behavioral: Negative for agitation, dysphoric mood and suicidal ideas. The patient is not nervous/anxious.     Per HPI unless specifically indicated above     Objective:    BP 116/66 (BP Location: Left Arm, Patient Position: Sitting, Cuff Size: Normal)   Pulse 81   Temp (!) 97.3 F (36.3 C)   Ht  (1.575 m)   Wt 136 lb 8 oz (61.9 kg)   SpO2 99%   BMI 24.97 kg/m   Wt Readings from Last 3 Encounters:  08/12/17 136 lb 8 oz (61.9 kg)  06/01/17 136 lb 8 oz (61.9 kg)  04/29/17 137 lb (62.1 kg)    Physical Exam  Constitutional: He is oriented to person, place, and time. He appears well-developed and well-nourished.  HENT:  Head: Normocephalic and atraumatic.  Neck: Neck supple.  Cardiovascular: Normal rate and regular rhythm.   Pulmonary/Chest: Effort normal and breath sounds normal. He has no wheezes.  Abdominal: Soft. Bowel sounds are normal. There is no hepatosplenomegaly. There is no tenderness.  Musculoskeletal: He exhibits no edema.  Lymphadenopathy:    He has no cervical adenopathy.  Neurological: He is alert and oriented to person, place, and time.  Skin: Skin is warm and dry.  Psychiatric: He has a normal mood and affect. His behavior is normal.  Vitals reviewed.       Assessment & Plan:   Encounter Diagnoses  Name Primary?  Marland Kitchen. Uncontrolled type 2 diabetes mellitus with complication, unspecified whether long term  insulin use (HCC) Yes  . Hyperlipidemia, unspecified hyperlipidemia type   . Elevated LFTs     -pt to get labs drawn -will refer for DM eye exam -nurse worked with pt on using his bs meter.  Pt to Monitor his bs -follow up 2 wk with bs log and will review labs at that time

## 2017-08-30 ENCOUNTER — Encounter: Payer: Self-pay | Admitting: Physician Assistant

## 2017-08-30 ENCOUNTER — Ambulatory Visit: Payer: Self-pay | Admitting: Physician Assistant

## 2017-08-30 VITALS — BP 130/70 | HR 89 | Temp 97.5°F | Ht 62.0 in | Wt 136.0 lb

## 2017-08-30 DIAGNOSIS — E785 Hyperlipidemia, unspecified: Secondary | ICD-10-CM

## 2017-08-30 DIAGNOSIS — E118 Type 2 diabetes mellitus with unspecified complications: Secondary | ICD-10-CM

## 2017-08-30 DIAGNOSIS — E875 Hyperkalemia: Secondary | ICD-10-CM

## 2017-08-30 MED ORDER — METFORMIN HCL 1000 MG PO TABS: ORAL_TABLET | ORAL | 4 refills | Status: DC

## 2017-08-30 MED ORDER — SIMVASTATIN 20 MG PO TABS: ORAL_TABLET | ORAL | 4 refills | Status: DC

## 2017-08-30 NOTE — Progress Notes (Signed)
BP 130/70 (BP Location: Left Arm, Patient Position: Sitting, Cuff Size: Normal)   Pulse 89   Temp (!) 97.5 F (36.4 C)   Ht  (1.575 m)   Wt 136 lb (61.7 kg)   SpO2 99%   BMI 24.87 kg/m    Subjective:    Patient ID: William [name], male    DOB: 1951-08-22, 66 y.o.   MRN: [id number]  HPI: William [name] is a 66 y.o. male presenting on 08/30/2017 for Diabetes   HPI Pt is feeling good today and has no complaints  Relevant past medical, surgical, family and social history reviewed and updated as indicated. Interim medical history since our last visit reviewed. Allergies and medications reviewed and updated.   Current Outpatient Prescriptions:  .  insulin NPH-regular Human (NOVOLIN 70/30) (70-30) 100 UNIT/ML injection, Inject 15 Units into the skin 2 (two) times daily with a meal., Disp: 10 mL, Rfl: 0 .  lovastatin (MEVACOR) 20 MG tablet, 1 po qhs.  Tome una tableta por boca al dormir, Disp: 30 tablet, Rfl: 4 .  nystatin cream (MYCOSTATIN), Apply thin film to affected areas bid.  aplique tira delgada a la area [employer] al dia, Disp: 30 g, Rfl: 0 .  metFORMIN (GLUCOPHAGE) 1000 MG tablet, 1 po bid.  Tome una tableta por boca dos veces diarias con comida (Patient not taking: Reported on 08/30/2017), Disp: 60 tablet, Rfl: 3   Review of Systems  Constitutional: Negative for appetite change, chills, diaphoresis, fatigue, fever and unexpected weight change.  HENT: Positive for dental problem. Negative for congestion, drooling, ear pain, facial swelling, hearing loss, mouth sores, sneezing, sore throat, trouble swallowing and voice change.   Eyes: Positive for visual disturbance. Negative for pain, discharge, redness and itching.  Respiratory: Negative for cough, choking, shortness of breath and wheezing.   Cardiovascular: Negative for chest pain, palpitations and leg swelling.  Gastrointestinal: Negative for abdominal pain, blood in stool, constipation, diarrhea and  vomiting.  Endocrine: Negative for cold intolerance, heat intolerance and polydipsia.  Genitourinary: Negative for decreased urine volume, dysuria and hematuria.  Musculoskeletal: Negative for arthralgias, back pain and gait problem.  Skin: Negative for rash.  Allergic/Immunologic: Negative for environmental allergies.  Neurological: Negative for seizures, syncope, light-headedness and headaches.  Hematological: Negative for adenopathy.  Psychiatric/Behavioral: Negative for agitation, dysphoric mood and suicidal ideas. The patient is not nervous/anxious.     Per HPI unless specifically indicated above     Objective:    BP 130/70 (BP Location: Left Arm, Patient Position: Sitting, Cuff Size: Normal)   Pulse 89   Temp (!) 97.5 F (36.4 C)   Ht  (1.575 m)   Wt 136 lb (61.7 kg)   SpO2 99%   BMI 24.87 kg/m   Wt Readings from Last 3 Encounters:  08/30/17 136 lb (61.7 kg)  08/12/17 136 lb 8 oz (61.9 kg)  06/01/17 136 lb 8 oz (61.9 kg)    Physical Exam  Constitutional: He is oriented to person, place, and time. He appears well-developed and well-nourished.  HENT:  Head: Normocephalic and atraumatic.  Neck: Neck supple.  Cardiovascular: Normal rate and regular rhythm.   Pulmonary/Chest: Effort normal and breath sounds normal. He has no wheezes.  Abdominal: Soft. Bowel sounds are normal. There is no hepatosplenomegaly. There is no tenderness.  Musculoskeletal: He exhibits no edema.  Lymphadenopathy:    He has no cervical adenopathy.  Neurological: He is alert and oriented to person, place, and time.  Skin:  Skin is warm and dry.  Psychiatric: He has a normal mood and affect. His behavior is normal.  Vitals reviewed.   Results for orders placed or performed during the hospital encounter of 08/12/17  Lipid panel  Result Value Ref Range   Cholesterol 234 (H) 0 - 200 mg/dL   Triglycerides 409 <811 mg/dL   HDL 61 >91 mg/dL   Total CHOL/HDL Ratio 3.8 RATIO   VLDL 25 0 - 40  mg/dL   LDL Cholesterol 478 (H) 0 - 99 mg/dL  Comprehensive metabolic panel  Result Value Ref Range   Sodium 138 135 - 145 mmol/L   Potassium 5.3 (H) 3.5 - 5.1 mmol/L   Chloride 101 101 - 111 mmol/L   CO2 30 22 - 32 mmol/L   Glucose, Bld 162 (H) 65 - 99 mg/dL   BUN 21 (H) 6 - 20 mg/dL   Creatinine, Ser 2.95 0.61 - 1.24 mg/dL   Calcium 9.5 8.9 - 62.1 mg/dL   Total Protein 7.8 6.5 - 8.1 g/dL   Albumin 4.4 3.5 - 5.0 g/dL   AST 20 15 - 41 U/L   ALT 19 17 - 63 U/L   Alkaline Phosphatase 99 38 - 126 U/L   Total Bilirubin 0.7 0.3 - 1.2 mg/dL   GFR calc non Af Amer >60 >60 mL/min   GFR calc Af Amer >60 >60 mL/min   Anion gap 7 5 - 15  Hemoglobin A1c  Result Value Ref Range   Hgb A1c MFr Bld 7.0 (H) 4.8 - 5.6 %   Mean Plasma Glucose 154.2 mg/dL      Assessment & Plan:   Encounter Diagnoses  Name Primary?  . Controlled diabetes mellitus type 2 with complications, unspecified whether long term insulin use (HCC) Yes  . Hyperlipidemia, unspecified hyperlipidemia type   . Hyperkalemia     -reviewed labs with pt -pt to Continue current dm meds -pt to Stop lovastatin and start simvastatin -recheck K+ in 2 wk -pt to follow up 3 months. RTO sooner prn

## 2017-09-06 ENCOUNTER — Ambulatory Visit: Payer: Self-pay | Admitting: Physician Assistant

## 2017-09-24 ENCOUNTER — Other Ambulatory Visit (HOSPITAL_COMMUNITY)
Admission: RE | Admit: 2017-09-24 | Discharge: 2017-09-24 | Disposition: A | Discharge status: Home / Self Care | Payer: Self-pay | Source: Ambulatory Visit | Attending: Physician Assistant | Admitting: Physician Assistant

## 2017-09-24 DIAGNOSIS — E875 Hyperkalemia: Secondary | ICD-10-CM | POA: Insufficient documentation

## 2017-09-24 LAB — POTASSIUM: POTASSIUM: 4.4 mmol/L (ref 3.5–5.1)

## 2017-10-05 ENCOUNTER — Encounter: Payer: Self-pay | Admitting: Student

## 2017-12-08 ENCOUNTER — Ambulatory Visit: Payer: Self-pay | Admitting: Physician Assistant

## 2017-12-20 ENCOUNTER — Ambulatory Visit: Payer: Self-pay | Admitting: Physician Assistant

## 2017-12-27 ENCOUNTER — Encounter: Payer: Self-pay | Admitting: Physician Assistant

## 2018-01-04 ENCOUNTER — Encounter: Payer: Self-pay | Admitting: Physician Assistant

## 2018-01-04 ENCOUNTER — Ambulatory Visit: Payer: Self-pay | Admitting: Physician Assistant

## 2018-01-04 VITALS — BP 122/72 | HR 96 | Temp 97.3°F | Ht 62.0 in | Wt 136.2 lb

## 2018-01-04 DIAGNOSIS — E118 Type 2 diabetes mellitus with unspecified complications: Secondary | ICD-10-CM

## 2018-01-04 DIAGNOSIS — E785 Hyperlipidemia, unspecified: Secondary | ICD-10-CM

## 2018-01-04 MED ORDER — INSULIN NPH ISOPHANE & REGULAR (70-30) 100 UNIT/ML ~~LOC~~ SUSP: SUBCUTANEOUS | 4 refills | Status: DC

## 2018-01-04 NOTE — Patient Instructions (Signed)
[facility] [place] [address] [place] [id number] Peggye LeyJueves 14 febrero 2019 8:40AM

## 2018-01-04 NOTE — Progress Notes (Signed)
BP 122/72 (BP Location: Right Arm, Patient Position: Sitting, Cuff Size: Normal)   Pulse 96   Temp (!) 97.3 F (36.3 C)   Ht 5\' 2"  (1.575 m)   Wt 136 lb 4 oz (61.8 kg)   SpO2 100%   BMI 24.92 kg/m    Subjective:    Patient ID: William [name], male    DOB: 1951/11/12, 67 y.o.   MRN: [id number]  HPI: William [name] is a 67 y.o. male presenting on 01/04/2018 for Diabetes and Hyperlipidemia   HPI   Pt has been out of his insulin about 2 months.  He says someone told him that there was something addictive in insulin so he stopped taking it.   Relevant past medical, surgical, family and social history reviewed and updated as indicated. Interim medical history since our last visit reviewed. Allergies and medications reviewed and updated.   Current Outpatient Medications:  .  metFORMIN (GLUCOPHAGE) 1000 MG tablet, 1 po bid.  Tome una tableta por boca dos veces diarias con comida, Disp: 60 tablet, Rfl: 4 .  nystatin cream (MYCOSTATIN), Apply thin film to affected areas bid.  aplique tira delgada a la area [employer] al dia, Disp: 30 g, Rfl: 0 .  simvastatin (ZOCOR) 20 MG tablet, 1 po qhs for cholesterol.  Tome una tableta por boca al dormir, Disp: 30 tablet, Rfl: 4 .  insulin NPH-regular Human (NOVOLIN 70/30) (70-30) 100 UNIT/ML injection, Inject 15 Units into the skin 2 (two) times daily with a meal. (Patient not taking: Reported on 01/04/2018), Disp: 10 mL, Rfl: 0   Review of Systems  Constitutional: Negative for appetite change, chills, diaphoresis, fatigue, fever and unexpected weight change.  HENT: Positive for dental problem. Negative for congestion, drooling, ear pain, facial swelling, hearing loss, mouth sores, sneezing, sore throat, trouble swallowing and voice change.   Eyes: Negative for pain, discharge, redness, itching and visual disturbance.  Respiratory: Negative for cough, choking, shortness of breath and wheezing.   Cardiovascular: Negative for chest pain,  palpitations and leg swelling.  Gastrointestinal: Negative for abdominal pain, blood in stool, constipation, diarrhea and vomiting.  Endocrine: Negative for cold intolerance, heat intolerance and polydipsia.  Genitourinary: Negative for decreased urine volume, dysuria and hematuria.  Musculoskeletal: Negative for arthralgias, back pain and gait problem.  Skin: Negative for rash.  Allergic/Immunologic: Negative for environmental allergies.  Neurological: Negative for seizures, syncope, light-headedness and headaches.  Hematological: Negative for adenopathy.  Psychiatric/Behavioral: Negative for agitation, dysphoric mood and suicidal ideas. The patient is not nervous/anxious.     Per HPI unless specifically indicated above     Objective:    BP 122/72 (BP Location: Right Arm, Patient Position: Sitting, Cuff Size: Normal)   Pulse 96   Temp (!) 97.3 F (36.3 C)   Ht 5\' 2"  (1.575 m)   Wt 136 lb 4 oz (61.8 kg)   SpO2 100%   BMI 24.92 kg/m   Wt Readings from Last 3 Encounters:  01/04/18 136 lb 4 oz (61.8 kg)  08/30/17 136 lb (61.7 kg)  08/12/17 136 lb 8 oz (61.9 kg)    Physical Exam  Constitutional: He is oriented to person, place, and time. He appears well-developed and well-nourished.  HENT:  Head: Normocephalic and atraumatic.  Neck: Neck supple.  Cardiovascular: Normal rate and regular rhythm.  Pulmonary/Chest: Effort normal and breath sounds normal. He has no wheezes.  Abdominal: Soft. Bowel sounds are normal. There is no hepatosplenomegaly. There is no tenderness.  Musculoskeletal: He  exhibits no edema.  Lymphadenopathy:    He has no cervical adenopathy.  Neurological: He is alert and oriented to person, place, and time.  Skin: Skin is warm and dry.  Psychiatric: He has a normal mood and affect. His behavior is normal.  Vitals reviewed.       Assessment & Plan:   Encounter Diagnoses  Name Primary?  . Controlled diabetes mellitus type 2 with complications,  unspecified whether long term insulin use (HCC) Yes  . Hyperlipidemia, unspecified hyperlipidemia type      -check fasting labs. We will call pt with results -pt was given [facility] -pt to continue current medications.  He was counseled about insulin and he will restart it -pt was given information about his upcoming diabetic eye exam -pt to follow up in 3 months.  RTO sooner prn

## 2018-01-05 ENCOUNTER — Other Ambulatory Visit: Payer: Self-pay | Admitting: Physician Assistant

## 2018-01-05 ENCOUNTER — Other Ambulatory Visit (HOSPITAL_COMMUNITY)
Admission: RE | Admit: 2018-01-05 | Discharge: 2018-01-05 | Disposition: A | Discharge status: Home / Self Care | Payer: Self-pay | Source: Ambulatory Visit | Attending: Physician Assistant | Admitting: Physician Assistant

## 2018-01-05 DIAGNOSIS — E785 Hyperlipidemia, unspecified: Secondary | ICD-10-CM

## 2018-01-05 DIAGNOSIS — Z125 Encounter for screening for malignant neoplasm of prostate: Secondary | ICD-10-CM

## 2018-01-05 DIAGNOSIS — IMO0002 Reserved for concepts with insufficient information to code with codable children: Secondary | ICD-10-CM

## 2018-01-05 DIAGNOSIS — E118 Type 2 diabetes mellitus with unspecified complications: Secondary | ICD-10-CM | POA: Insufficient documentation

## 2018-01-05 DIAGNOSIS — E1165 Type 2 diabetes mellitus with hyperglycemia: Secondary | ICD-10-CM

## 2018-01-05 LAB — COMPREHENSIVE METABOLIC PANEL
ALT: 17 U/L (ref 17–63)
AST: 18 U/L (ref 15–41)
Albumin: 4.2 g/dL (ref 3.5–5.0)
Alkaline Phosphatase: 106 U/L (ref 38–126)
Anion gap: 9 (ref 5–15)
BUN: 20 mg/dL (ref 6–20)
CHLORIDE: 100 mmol/L — AB (ref 101–111)
CO2: 27 mmol/L (ref 22–32)
Calcium: 9.4 mg/dL (ref 8.9–10.3)
Creatinine, Ser: 0.87 mg/dL (ref 0.61–1.24)
GFR calc non Af Amer: >60 mL/min (ref >60)
Glucose, Bld: 189 mg/dL — ABNORMAL HIGH (ref 65–99)
POTASSIUM: 4.3 mmol/L (ref 3.5–5.1)
Sodium: 136 mmol/L (ref 135–145)
Total Bilirubin: 0.8 mg/dL (ref 0.3–1.2)
Total Protein: 7.5 g/dL (ref 6.5–8.1)

## 2018-01-05 LAB — HEMOGLOBIN A1C
Hgb A1c MFr Bld: 10.3 % — ABNORMAL HIGH (ref 4.8–5.6)
Mean Plasma Glucose: 248.91 mg/dL

## 2018-01-05 LAB — LIPID PANEL
CHOL/HDL RATIO: 3.3 ratio
CHOLESTEROL: 175 mg/dL (ref 0–200)
HDL: 53 mg/dL (ref >40)
LDL Cholesterol: 101 mg/dL — ABNORMAL HIGH (ref 0–99)
TRIGLYCERIDES: 106 mg/dL (ref <150)
VLDL: 21 mg/dL (ref 0–40)

## 2018-01-24 ENCOUNTER — Encounter: Payer: Self-pay | Admitting: Physician Assistant

## 2018-03-28 ENCOUNTER — Other Ambulatory Visit (HOSPITAL_COMMUNITY)
Admission: RE | Admit: 2018-03-28 | Discharge: 2018-03-28 | Disposition: A | Discharge status: Home / Self Care | Payer: Self-pay | Source: Ambulatory Visit | Attending: Physician Assistant | Admitting: Physician Assistant

## 2018-03-28 DIAGNOSIS — IMO0002 Reserved for concepts with insufficient information to code with codable children: Secondary | ICD-10-CM

## 2018-03-28 DIAGNOSIS — E785 Hyperlipidemia, unspecified: Secondary | ICD-10-CM | POA: Insufficient documentation

## 2018-03-28 DIAGNOSIS — Z125 Encounter for screening for malignant neoplasm of prostate: Secondary | ICD-10-CM | POA: Insufficient documentation

## 2018-03-28 DIAGNOSIS — E1165 Type 2 diabetes mellitus with hyperglycemia: Secondary | ICD-10-CM | POA: Insufficient documentation

## 2018-03-28 DIAGNOSIS — E118 Type 2 diabetes mellitus with unspecified complications: Secondary | ICD-10-CM | POA: Insufficient documentation

## 2018-03-28 LAB — LIPID PANEL
CHOL/HDL RATIO: 4.7 ratio
Cholesterol: 224 mg/dL — ABNORMAL HIGH (ref 0–200)
HDL: 48 mg/dL (ref >40)
LDL CALC: 162 mg/dL — AB (ref 0–99)
Triglycerides: 69 mg/dL (ref <150)
VLDL: 14 mg/dL (ref 0–40)

## 2018-03-28 LAB — COMPREHENSIVE METABOLIC PANEL
ALK PHOS: 107 U/L (ref 38–126)
ALT: 15 U/L — AB (ref 17–63)
AST: 19 U/L (ref 15–41)
Albumin: 4 g/dL (ref 3.5–5.0)
Anion gap: 9 (ref 5–15)
BUN: 20 mg/dL (ref 6–20)
CALCIUM: 9.3 mg/dL (ref 8.9–10.3)
CO2: 28 mmol/L (ref 22–32)
CREATININE: 0.9 mg/dL (ref 0.61–1.24)
Chloride: 103 mmol/L (ref 101–111)
GFR calc non Af Amer: >60 mL/min (ref >60)
GLUCOSE: 110 mg/dL — AB (ref 65–99)
Potassium: 4.3 mmol/L (ref 3.5–5.1)
SODIUM: 140 mmol/L (ref 135–145)
Total Bilirubin: 0.6 mg/dL (ref 0.3–1.2)
Total Protein: 7.4 g/dL (ref 6.5–8.1)

## 2018-03-28 LAB — HEMOGLOBIN A1C
HEMOGLOBIN A1C: 10.1 % — AB (ref 4.8–5.6)
Mean Plasma Glucose: 243.17 mg/dL

## 2018-03-28 LAB — PSA: Prostatic Specific Antigen: 2.45 ng/mL (ref 0.00–4.00)

## 2018-03-29 LAB — MICROALBUMIN, URINE: Microalb, Ur: 112.3 ug/mL — ABNORMAL HIGH

## 2018-04-05 ENCOUNTER — Encounter: Payer: Self-pay | Admitting: Physician Assistant

## 2018-04-05 ENCOUNTER — Ambulatory Visit: Payer: Self-pay | Admitting: Physician Assistant

## 2018-04-05 VITALS — BP 127/77 | HR 78 | Temp 97.5°F | Ht 62.0 in | Wt 142.5 lb

## 2018-04-05 DIAGNOSIS — E1165 Type 2 diabetes mellitus with hyperglycemia: Secondary | ICD-10-CM

## 2018-04-05 DIAGNOSIS — E785 Hyperlipidemia, unspecified: Secondary | ICD-10-CM

## 2018-04-05 DIAGNOSIS — E118 Type 2 diabetes mellitus with unspecified complications: Principal | ICD-10-CM

## 2018-04-05 DIAGNOSIS — IMO0002 Reserved for concepts with insufficient information to code with codable children: Secondary | ICD-10-CM

## 2018-04-05 MED ORDER — SIMVASTATIN 40 MG PO TABS: ORAL_TABLET | ORAL | 4 refills | Status: DC

## 2018-04-05 NOTE — Progress Notes (Signed)
BP 127/77   Pulse 78   Temp (!) 97.5 F (36.4 C)   Ht  (1.575 m)   Wt 142 lb 8 oz (64.6 kg)   SpO2 98%   BMI 26.06 kg/m    Subjective:    Patient ID: William [name], male    DOB: 01/03/1951, 67 y.o.   MRN: [id number]  HPI: William [name] is a 67 y.o. male presenting on 04/05/2018 for Diabetes and Hyperlipidemia   HPI   Pt is feeling well. He uses his meds as prescribed and says he is eating low-fat diabetic diet.  He says he checks his bs every three or four days and it is okay.  He does not have a bs log.   Relevant past medical, surgical, family and social history reviewed and updated as indicated. Interim medical history since our last visit reviewed. Allergies and medications reviewed and updated.   Current Outpatient Medications:  .  insulin NPH-regular Human (NOVOLIN 70/30) (70-30) 100 UNIT/ML injection, Inject 15 units into the skin twice daily with a meal.   inyecte 15 unidades a la piel dos veces al dia con el alimento, Disp: 10 mL, Rfl: 4 .  metFORMIN (GLUCOPHAGE) 1000 MG tablet, 1 po bid.  Tome una tableta por boca dos veces diarias con comida, Disp: 60 tablet, Rfl: 4 .  nystatin cream (MYCOSTATIN), Apply thin film to affected areas bid.  aplique tira delgada a la area [employer] al dia, Disp: 30 g, Rfl: 0 .  simvastatin (ZOCOR) 20 MG tablet, 1 po qhs for cholesterol.  Tome una tableta por boca al dormir, Disp: 30 tablet, Rfl: 4  Review of Systems  Constitutional: Negative for appetite change, chills, diaphoresis, fatigue, fever and unexpected weight change.  HENT: Positive for dental problem. Negative for congestion, drooling, ear pain, facial swelling, hearing loss, mouth sores, sneezing, sore throat, trouble swallowing and voice change.   Eyes: Negative for pain, discharge, redness, itching and visual disturbance.  Respiratory: Negative for cough, choking, shortness of breath and wheezing.   Cardiovascular: Negative for chest pain, palpitations and  leg swelling.  Gastrointestinal: Negative for abdominal pain, blood in stool, constipation, diarrhea and vomiting.  Endocrine: Negative for cold intolerance, heat intolerance and polydipsia.  Genitourinary: Negative for decreased urine volume, dysuria and hematuria.  Musculoskeletal: Negative for arthralgias, back pain and gait problem.  Skin: Negative for rash.  Allergic/Immunologic: Negative for environmental allergies.  Neurological: Negative for seizures, syncope, light-headedness and headaches.  Hematological: Negative for adenopathy.  Psychiatric/Behavioral: Negative for agitation, dysphoric mood and suicidal ideas. The patient is not nervous/anxious.     Per HPI unless specifically indicated above     Objective:    BP 127/77   Pulse 78   Temp (!) 97.5 F (36.4 C)   Ht  (1.575 m)   Wt 142 lb 8 oz (64.6 kg)   SpO2 98%   BMI 26.06 kg/m   Wt Readings from Last 3 Encounters:  04/05/18 142 lb 8 oz (64.6 kg)  01/04/18 136 lb 4 oz (61.8 kg)  08/30/17 136 lb (61.7 kg)    Physical Exam  Constitutional: He is oriented to person, place, and time. He appears well-developed and well-nourished.  HENT:  Head: Normocephalic and atraumatic.  Neck: Neck supple.  Cardiovascular: Normal rate and regular rhythm.  Pulmonary/Chest: Effort normal and breath sounds normal. He has no wheezes.  Abdominal: Soft. Bowel sounds are normal. There is no hepatosplenomegaly. There is no tenderness.  Musculoskeletal: He  exhibits no edema.  Lymphadenopathy:    He has no cervical adenopathy.  Neurological: He is alert and oriented to person, place, and time.  Skin: Skin is warm and dry.  Psychiatric: He has a normal mood and affect. His behavior is normal.  Vitals reviewed.   Results for orders placed or performed during the hospital encounter of 03/28/18  Microalbumin, urine  Result Value Ref Range   Microalb, Ur 112.3 (H) Not Estab. ug/mL  PSA  Result Value Ref Range   Prostatic Specific  Antigen 2.45 0.00 - 4.00 ng/mL  Comprehensive metabolic panel  Result Value Ref Range   Sodium 140 135 - 145 mmol/L   Potassium 4.3 3.5 - 5.1 mmol/L   Chloride 103 101 - 111 mmol/L   CO2 28 22 - 32 mmol/L   Glucose, Bld 110 (H) 65 - 99 mg/dL   BUN 20 6 - 20 mg/dL   Creatinine, Ser 9.60 0.61 - 1.24 mg/dL   Calcium 9.3 8.9 - 45.4 mg/dL   Total Protein 7.4 6.5 - 8.1 g/dL   Albumin 4.0 3.5 - 5.0 g/dL   AST 19 15 - 41 U/L   ALT 15 (L) 17 - 63 U/L   Alkaline Phosphatase 107 38 - 126 U/L   Total Bilirubin 0.6 0.3 - 1.2 mg/dL   GFR calc non Af Amer >60 >60 mL/min   GFR calc Af Amer >60 >60 mL/min   Anion gap 9 5 - 15  Lipid panel  Result Value Ref Range   Cholesterol 224 (H) 0 - 200 mg/dL   Triglycerides 69 <098 mg/dL   HDL 48 >11 mg/dL   Total CHOL/HDL Ratio 4.7 RATIO   VLDL 14 0 - 40 mg/dL   LDL Cholesterol 914 (H) 0 - 99 mg/dL  Hemoglobin N8G  Result Value Ref Range   Hgb A1c MFr Bld 10.1 (H) 4.8 - 5.6 %   Mean Plasma Glucose 243.17 mg/dL      Assessment & Plan:    Encounter Diagnoses  Name Primary?  Marland Kitchen Uncontrolled type 2 diabetes mellitus with complication (HCC) Yes  . Hyperlipidemia, unspecified hyperlipidemia type     -reviewed labs with pt -increase simvastatin.  Pt to continue lowfat diet -pt to Check fbs before breakfast and before supper for next 2 week.  He is to bring bs log with him to follow up appointment in 2 weeks so we can adjust DM meds to get better control.

## 2018-04-18 ENCOUNTER — Ambulatory Visit: Payer: Self-pay | Admitting: Physician Assistant

## 2018-04-18 ENCOUNTER — Encounter: Payer: Self-pay | Admitting: Physician Assistant

## 2018-04-18 VITALS — BP 104/59 | HR 82 | Temp 97.7°F | Ht 62.0 in | Wt 141.5 lb

## 2018-04-18 DIAGNOSIS — E118 Type 2 diabetes mellitus with unspecified complications: Principal | ICD-10-CM

## 2018-04-18 DIAGNOSIS — E1165 Type 2 diabetes mellitus with hyperglycemia: Secondary | ICD-10-CM

## 2018-04-18 DIAGNOSIS — IMO0002 Reserved for concepts with insufficient information to code with codable children: Secondary | ICD-10-CM

## 2018-04-18 DIAGNOSIS — E785 Hyperlipidemia, unspecified: Secondary | ICD-10-CM

## 2018-04-18 NOTE — Progress Notes (Signed)
BP (!) 104/59 (BP Location: Right Arm, Patient Position: Sitting, Cuff Size: Normal)   Pulse 82   Temp 97.7 F (36.5 C) (Other (Comment))   Ht  (1.575 m)   Wt 141 lb 8 oz (64.2 kg)   SpO2 100%   BMI 25.88 kg/m    Subjective:    Patient ID: William [name], male    DOB: 1950-12-08, 67 y.o.   MRN: [id number]  HPI: Kalel Harty is a 67 y.o. male presenting on 04/18/2018 for Diabetes   HPI   Pt is using 15u insulin bid.  He has bs log.  He is feeling very good.  bs log reviewed- values 150-200.  Relevant past medical, surgical, family and social history reviewed and updated as indicated. Interim medical history since our last visit reviewed. Allergies and medications reviewed and updated.    Current Outpatient Medications:  .  insulin NPH-regular Human (NOVOLIN 70/30) (70-30) 100 UNIT/ML injection, Inject 15 units into the skin twice daily with a meal.   inyecte 15 unidades a la piel dos veces al dia con el alimento, Disp: 10 mL, Rfl: 4 .  metFORMIN (GLUCOPHAGE) 1000 MG tablet, 1 po bid.  Tome una tableta por boca dos veces diarias con comida, Disp: 60 tablet, Rfl: 4 .  nystatin cream (MYCOSTATIN), Apply thin film to affected areas bid.  aplique tira delgada a la area [employer] al dia, Disp: 30 g, Rfl: 0 .  simvastatin (ZOCOR) 40 MG tablet, 1 po qhs for cholesterol.  Tome una tableta por boca al dormir para [employer], Disp: 30 tablet, Rfl: 4  Review of Systems  Constitutional: Negative for appetite change, chills, diaphoresis, fatigue, fever and unexpected weight change.  HENT: Positive for dental problem. Negative for congestion, drooling, ear pain, facial swelling, hearing loss, mouth sores, sneezing, sore throat, trouble swallowing and voice change.   Eyes: Negative for pain, discharge, redness, itching and visual disturbance.  Respiratory: Negative for cough, choking, shortness of breath and wheezing.   Cardiovascular: Negative for chest pain, palpitations  and leg swelling.  Gastrointestinal: Negative for abdominal pain, blood in stool, constipation, diarrhea and vomiting.  Endocrine: Negative for cold intolerance, heat intolerance and polydipsia.  Genitourinary: Negative for decreased urine volume, dysuria and hematuria.  Musculoskeletal: Positive for back pain. Negative for arthralgias and gait problem.  Skin: Negative for rash.  Allergic/Immunologic: Negative for environmental allergies.  Neurological: Negative for seizures, syncope, light-headedness and headaches.  Hematological: Negative for adenopathy.  Psychiatric/Behavioral: Negative for agitation, dysphoric mood and suicidal ideas. The patient is not nervous/anxious.     Per HPI unless specifically indicated above     Objective:    BP (!) 104/59 (BP Location: Right Arm, Patient Position: Sitting, Cuff Size: Normal)   Pulse 82   Temp 97.7 F (36.5 C) (Other (Comment))   Ht  (1.575 m)   Wt 141 lb 8 oz (64.2 kg)   SpO2 100%   BMI 25.88 kg/m   Wt Readings from Last 3 Encounters:  04/18/18 141 lb 8 oz (64.2 kg)  04/05/18 142 lb 8 oz (64.6 kg)  01/04/18 136 lb 4 oz (61.8 kg)    Physical Exam  Constitutional: He is oriented to person, place, and time. He appears well-developed and well-nourished.  HENT:  Head: Normocephalic and atraumatic.  Neck: Neck supple.  Cardiovascular: Normal rate and regular rhythm.  Pulmonary/Chest: Effort normal and breath sounds normal. He has no wheezes.  Abdominal: Soft. Bowel sounds are normal. There  is no hepatosplenomegaly. There is no tenderness.  Musculoskeletal: He exhibits no edema.  Lymphadenopathy:    He has no cervical adenopathy.  Neurological: He is alert and oriented to person, place, and time.  Skin: Skin is warm and dry.  Psychiatric: He has a normal mood and affect. His behavior is normal.  Vitals reviewed.       Assessment & Plan:    Encounter Diagnoses  Name Primary?  Marland Kitchen Uncontrolled type 2 diabetes mellitus  with complication (HCC) Yes  . Hyperlipidemia, unspecified hyperlipidemia type     -Increase insulin to 18units bid.  Pt is reminded to call office for fbs < 70 or > 300 -pt to Follow up 2 1/2 mo with labs before appointment.  RTO sooner prn

## 2018-04-29 IMAGING — DX DG FINGER MIDDLE 2+V*R*
3 series · 3 of 3 positions shown · non-contrast
Comparison: None.

CLINICAL DATA: Caught finger in tire changing machine with
laceration, initial encounter

EXAM:
RIGHT MIDDLE FINGER 2+V

[finger ap]
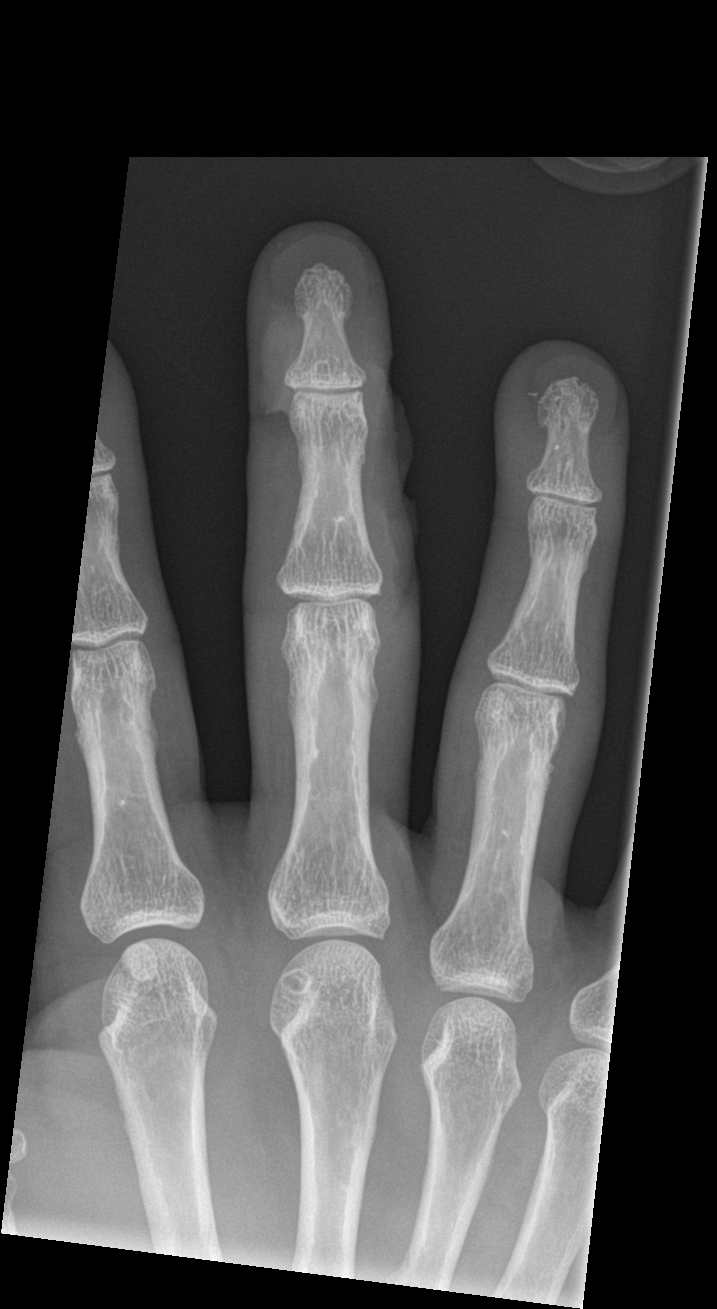

[finger obl]
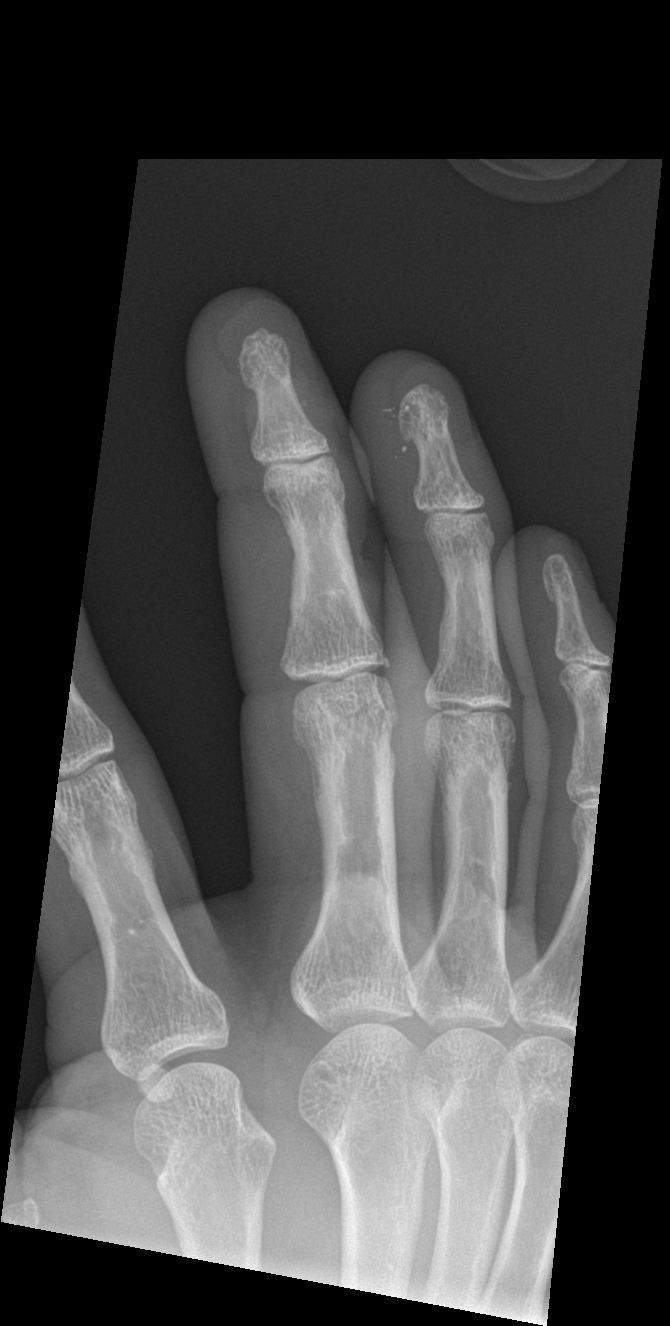

[finger lat]
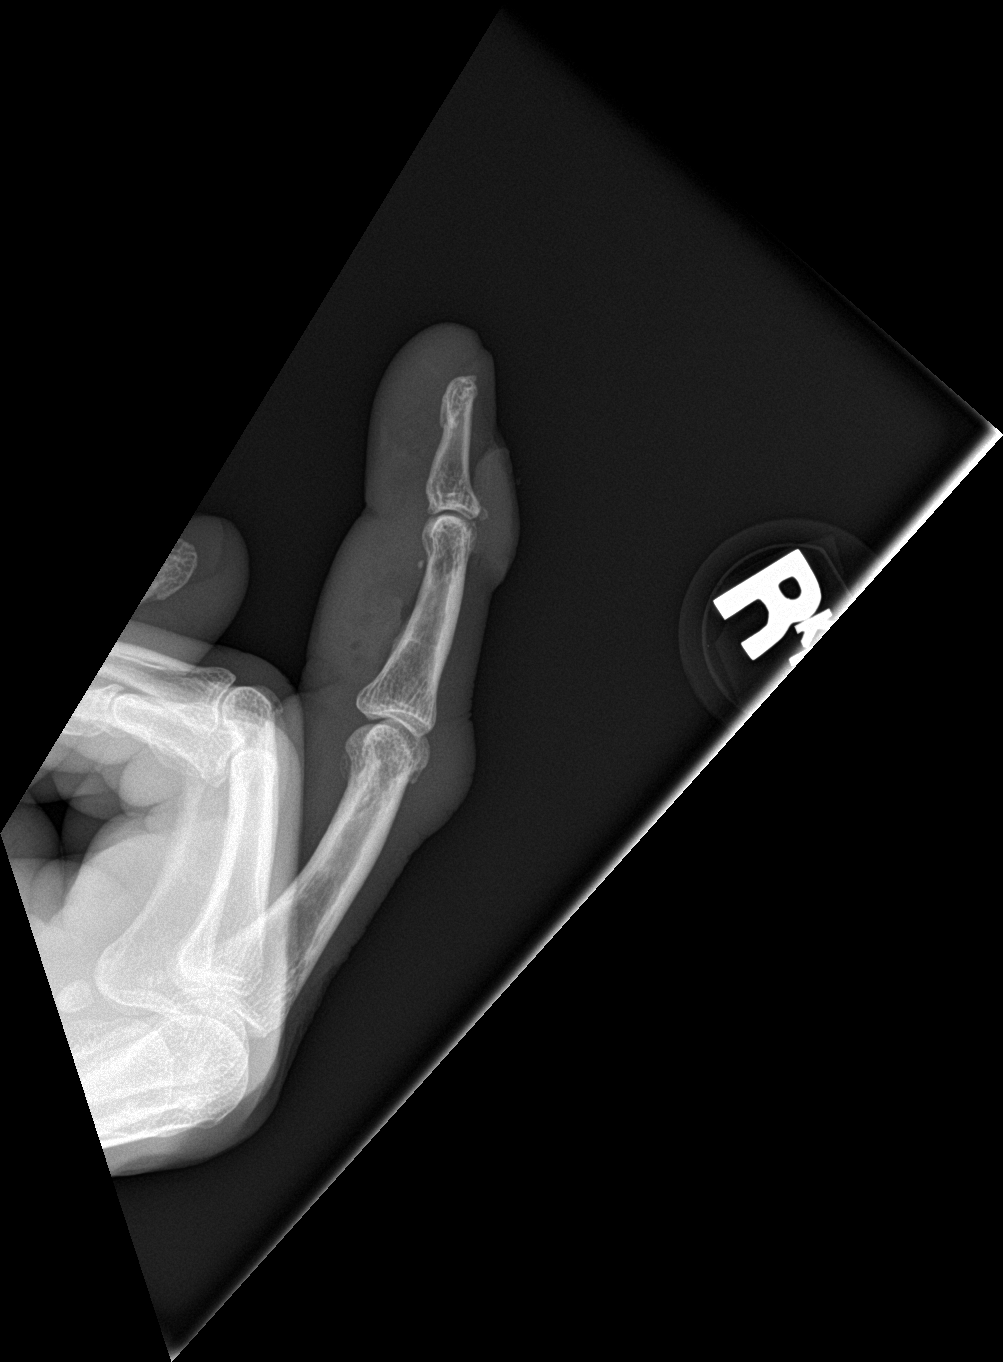

[3 of 3 positions shown; findings below may reference images not displayed]

FINDINGS: Considerable soft tissue defect is noted in the third digit
consistent with the recent injury. Mild avulsion from the posterior
aspect of the distal phalanx proximally is noted. A tiny density is
also noted within the soft tissues only on the lateral projection
which may be related to a small foreign body. Some foreign bodies
are noted within the soft tissues of the distal aspect of the fourth
digit. These are likely chronic.
IMPRESSION: Soft tissue changes in the third digit.

Small avulsion fracture at the base of the third distal phalanx.

## 2018-06-28 ENCOUNTER — Other Ambulatory Visit (HOSPITAL_COMMUNITY)
Admission: RE | Admit: 2018-06-28 | Discharge: 2018-06-28 | Disposition: A | Discharge status: Home / Self Care | Payer: Self-pay | Source: Ambulatory Visit | Attending: Physician Assistant | Admitting: Physician Assistant

## 2018-06-28 DIAGNOSIS — E118 Type 2 diabetes mellitus with unspecified complications: Secondary | ICD-10-CM | POA: Insufficient documentation

## 2018-06-28 DIAGNOSIS — IMO0002 Reserved for concepts with insufficient information to code with codable children: Secondary | ICD-10-CM

## 2018-06-28 DIAGNOSIS — E1165 Type 2 diabetes mellitus with hyperglycemia: Secondary | ICD-10-CM | POA: Insufficient documentation

## 2018-06-28 DIAGNOSIS — E785 Hyperlipidemia, unspecified: Secondary | ICD-10-CM | POA: Insufficient documentation

## 2018-06-28 LAB — COMPREHENSIVE METABOLIC PANEL
ALBUMIN: 4.1 g/dL (ref 3.5–5.0)
ALT: 17 U/L (ref 0–44)
ANION GAP: 5 (ref 5–15)
AST: 17 U/L (ref 15–41)
Alkaline Phosphatase: 98 U/L (ref 38–126)
BUN: 21 mg/dL (ref 8–23)
CHLORIDE: 105 mmol/L (ref 98–111)
CO2: 30 mmol/L (ref 22–32)
Calcium: 9.3 mg/dL (ref 8.9–10.3)
Creatinine, Ser: 0.95 mg/dL (ref 0.61–1.24)
GFR calc Af Amer: >60 mL/min (ref >60)
GFR calc non Af Amer: >60 mL/min (ref >60)
GLUCOSE: 187 mg/dL — AB (ref 70–99)
POTASSIUM: 5 mmol/L (ref 3.5–5.1)
SODIUM: 140 mmol/L (ref 135–145)
TOTAL PROTEIN: 7.6 g/dL (ref 6.5–8.1)
Total Bilirubin: 0.8 mg/dL (ref 0.3–1.2)

## 2018-06-28 LAB — LIPID PANEL
CHOL/HDL RATIO: 3.6 ratio
Cholesterol: 167 mg/dL (ref 0–200)
HDL: 46 mg/dL (ref >40)
LDL CALC: 102 mg/dL — AB (ref 0–99)
Triglycerides: 96 mg/dL (ref <150)
VLDL: 19 mg/dL (ref 0–40)

## 2018-06-28 LAB — HEMOGLOBIN A1C
HEMOGLOBIN A1C: 7.9 % — AB (ref 4.8–5.6)
MEAN PLASMA GLUCOSE: 180.03 mg/dL

## 2018-07-05 ENCOUNTER — Other Ambulatory Visit: Payer: Self-pay | Admitting: Physician Assistant

## 2018-07-05 ENCOUNTER — Encounter: Payer: Self-pay | Admitting: Physician Assistant

## 2018-07-05 ENCOUNTER — Ambulatory Visit: Payer: Self-pay | Admitting: Physician Assistant

## 2018-07-05 VITALS — BP 112/66 | HR 88 | Temp 97.3°F | Ht 62.0 in | Wt 145.5 lb

## 2018-07-05 DIAGNOSIS — E1165 Type 2 diabetes mellitus with hyperglycemia: Secondary | ICD-10-CM

## 2018-07-05 DIAGNOSIS — E785 Hyperlipidemia, unspecified: Secondary | ICD-10-CM

## 2018-07-05 DIAGNOSIS — Z1211 Encounter for screening for malignant neoplasm of colon: Secondary | ICD-10-CM

## 2018-07-05 DIAGNOSIS — E118 Type 2 diabetes mellitus with unspecified complications: Principal | ICD-10-CM

## 2018-07-05 DIAGNOSIS — IMO0002 Reserved for concepts with insufficient information to code with codable children: Secondary | ICD-10-CM

## 2018-07-05 NOTE — Progress Notes (Signed)
BP 112/66 (BP Location: Left Arm, Patient Position: Sitting, Cuff Size: Normal)   Pulse 88   Temp (!) 97.3 F (36.3 C)   Ht 5\' 2"  (1.575 m)   Wt 145 lb 8 oz (66 kg)   SpO2 96%   BMI 26.61 kg/m    Subjective:    Patient ID: William [name], male    DOB: [birth year], 67 y.o.   MRN: [id number]  HPI: William [name] is a 67 y.o. male presenting on 07/05/2018 for Diabetes   HPI Chief Complaint  Patient presents with  . Diabetes   Pt says he Feels great  Pt has his bs log with range- 160-220  Relevant past medical, surgical, family and social history reviewed and updated as indicated. Interim medical history since our last visit reviewed. Allergies and medications reviewed and updated.   Current Outpatient Medications:  .  insulin NPH-regular Human (NOVOLIN 70/30) (70-30) 100 UNIT/ML injection, Inject 15 units into the skin twice daily with a meal.   inyecte 15 unidades a la piel dos veces al dia con el alimento (Patient taking differently: Inject 19 Units into the skin 2 (two) times daily with a meal. Inject 15 units into the skin twice daily with a meal.   inyecte 15 unidades a la piel dos veces al dia con el alimento), Disp: 10 mL, Rfl: 4 .  metFORMIN (GLUCOPHAGE) 1000 MG tablet, 1 po bid.  Tome una tableta por boca dos veces diarias con comida, Disp: 60 tablet, Rfl: 4 .  nystatin cream (MYCOSTATIN), Apply thin film to affected areas bid.  aplique tira delgada a la area [employer] al dia, Disp: 30 g, Rfl: 0 .  simvastatin (ZOCOR) 40 MG tablet, 1 po qhs for cholesterol.  Tome una tableta por boca al dormir para Print production planner, Disp: 30 tablet, Rfl: 4   Review of Systems  Constitutional: Negative for appetite change, chills, diaphoresis, fatigue, fever and unexpected weight change.  HENT: Negative for congestion, dental problem, drooling, ear pain, facial swelling, hearing loss, mouth sores, sneezing, sore throat, trouble swallowing and voice change.   Eyes: Negative for  pain, discharge, redness, itching and visual disturbance.  Respiratory: Negative for cough, choking, shortness of breath and wheezing.   Cardiovascular: Negative for chest pain, palpitations and leg swelling.  Gastrointestinal: Negative for abdominal pain, blood in stool, constipation, diarrhea and vomiting.  Endocrine: Negative for cold intolerance, heat intolerance and polydipsia.  Genitourinary: Negative for decreased urine volume, dysuria and hematuria.  Musculoskeletal: Negative for arthralgias, back pain and gait problem.  Skin: Negative for rash.  Allergic/Immunologic: Negative for environmental allergies.  Neurological: Negative for seizures, syncope, light-headedness and headaches.  Hematological: Negative for adenopathy.  Psychiatric/Behavioral: Negative for agitation, dysphoric mood and suicidal ideas. The patient is not nervous/anxious.     Per HPI unless specifically indicated above     Objective:    BP 112/66 (BP Location: Left Arm, Patient Position: Sitting, Cuff Size: Normal)   Pulse 88   Temp (!) 97.3 F (36.3 C)   Ht 5\' 2"  (1.575 m)   Wt 145 lb 8 oz (66 kg)   SpO2 96%   BMI 26.61 kg/m   Wt Readings from Last 3 Encounters:  07/05/18 145 lb 8 oz (66 kg)  04/18/18 141 lb 8 oz (64.2 kg)  04/05/18 142 lb 8 oz (64.6 kg)    Physical Exam  Constitutional: He is oriented to person, place, and time. He appears well-developed and well-nourished.  HENT:  Head:  Normocephalic and atraumatic.  Neck: Neck supple.  Cardiovascular: Normal rate and regular rhythm.  Pulmonary/Chest: Effort normal and breath sounds normal. He has no wheezes.  Abdominal: Soft. Bowel sounds are normal. There is no hepatosplenomegaly. There is no tenderness.  Musculoskeletal: He exhibits no edema.  Lymphadenopathy:    He has no cervical adenopathy.  Neurological: He is alert and oriented to person, place, and time.  Skin: Skin is warm and dry.  Psychiatric: He has a normal mood and affect. His  behavior is normal.  Vitals reviewed.   Results for orders placed or performed during the hospital encounter of 06/28/18  Comprehensive metabolic panel  Result Value Ref Range   Sodium 140 135 - 145 mmol/L   Potassium 5.0 3.5 - 5.1 mmol/L   Chloride 105 98 - 111 mmol/L   CO2 30 22 - 32 mmol/L   Glucose, Bld 187 (H) 70 - 99 mg/dL   BUN 21 8 - 23 mg/dL   Creatinine, Ser 1.610.95 0.61 - 1.24 mg/dL   Calcium 9.3 8.9 - 09.610.3 mg/dL   Total Protein 7.6 6.5 - 8.1 g/dL   Albumin 4.1 3.5 - 5.0 g/dL   AST 17 15 - 41 U/L   ALT 17 0 - 44 U/L   Alkaline Phosphatase 98 38 - 126 U/L   Total Bilirubin 0.8 0.3 - 1.2 mg/dL   GFR calc non Af Amer >60 >60 mL/min   GFR calc Af Amer >60 >60 mL/min   Anion gap 5 5 - 15  Lipid panel  Result Value Ref Range   Cholesterol 167 0 - 200 mg/dL   Triglycerides 96 <045<150 mg/dL   HDL 46 >40>40 mg/dL   Total CHOL/HDL Ratio 3.6 RATIO   VLDL 19 0 - 40 mg/dL   LDL Cholesterol 981102 (H) 0 - 99 mg/dL  Hemoglobin X9JA1c  Result Value Ref Range   Hgb A1c MFr Bld 7.9 (H) 4.8 - 5.6 %   Mean Plasma Glucose 180.03 mg/dL      Assessment & Plan:   Encounter Diagnoses  Name Primary?  Marland Kitchen. Uncontrolled type 2 diabetes mellitus with complication (HCC) Yes  . Hyperlipidemia, unspecified hyperlipidemia type   . Screening for colon cancer     -reviewed labs with pt -pt to increase insulin to 20units bid and watch diabetic diet.  He is reminded to call office for fbs < 70 or > 300. -pt was given iFOBT for colon cancer screening -pt to follow up 3 months.  RTO sooner prn

## 2018-09-30 ENCOUNTER — Other Ambulatory Visit (HOSPITAL_COMMUNITY)
Admission: RE | Admit: 2018-09-30 | Discharge: 2018-09-30 | Disposition: A | Discharge status: Home / Self Care | Payer: Self-pay | Source: Ambulatory Visit | Attending: Physician Assistant | Admitting: Physician Assistant

## 2018-09-30 DIAGNOSIS — E785 Hyperlipidemia, unspecified: Secondary | ICD-10-CM | POA: Insufficient documentation

## 2018-09-30 DIAGNOSIS — IMO0002 Reserved for concepts with insufficient information to code with codable children: Secondary | ICD-10-CM

## 2018-09-30 DIAGNOSIS — E118 Type 2 diabetes mellitus with unspecified complications: Secondary | ICD-10-CM | POA: Insufficient documentation

## 2018-09-30 DIAGNOSIS — E1165 Type 2 diabetes mellitus with hyperglycemia: Secondary | ICD-10-CM | POA: Insufficient documentation

## 2018-09-30 LAB — LIPID PANEL
CHOL/HDL RATIO: 3.7 ratio
Cholesterol: 209 mg/dL — ABNORMAL HIGH (ref 0–200)
HDL: 56 mg/dL (ref >40)
LDL CALC: 134 mg/dL — AB (ref 0–99)
TRIGLYCERIDES: 96 mg/dL (ref <150)
VLDL: 19 mg/dL (ref 0–40)

## 2018-09-30 LAB — COMPREHENSIVE METABOLIC PANEL
ALT: 20 U/L (ref 0–44)
AST: 21 U/L (ref 15–41)
Albumin: 4.5 g/dL (ref 3.5–5.0)
Alkaline Phosphatase: 115 U/L (ref 38–126)
Anion gap: 8 (ref 5–15)
BUN: 17 mg/dL (ref 8–23)
CALCIUM: 9.4 mg/dL (ref 8.9–10.3)
CHLORIDE: 102 mmol/L (ref 98–111)
CO2: 29 mmol/L (ref 22–32)
CREATININE: 0.9 mg/dL (ref 0.61–1.24)
Glucose, Bld: 208 mg/dL — ABNORMAL HIGH (ref 70–99)
Potassium: 4.6 mmol/L (ref 3.5–5.1)
SODIUM: 139 mmol/L (ref 135–145)
Total Bilirubin: 0.7 mg/dL (ref 0.3–1.2)
Total Protein: 7.9 g/dL (ref 6.5–8.1)

## 2018-09-30 LAB — HEMOGLOBIN A1C
HEMOGLOBIN A1C: 7.8 % — AB (ref 4.8–5.6)
Mean Plasma Glucose: 177.16 mg/dL

## 2018-10-05 ENCOUNTER — Ambulatory Visit: Payer: Self-pay | Admitting: Physician Assistant

## 2018-10-05 ENCOUNTER — Encounter: Payer: Self-pay | Admitting: Physician Assistant

## 2018-10-05 VITALS — BP 110/60 | HR 93 | Temp 97.7°F | Ht 62.0 in | Wt 140.8 lb

## 2018-10-05 DIAGNOSIS — E785 Hyperlipidemia, unspecified: Secondary | ICD-10-CM

## 2018-10-05 DIAGNOSIS — E118 Type 2 diabetes mellitus with unspecified complications: Principal | ICD-10-CM

## 2018-10-05 DIAGNOSIS — R0602 Shortness of breath: Secondary | ICD-10-CM

## 2018-10-05 DIAGNOSIS — IMO0002 Reserved for concepts with insufficient information to code with codable children: Secondary | ICD-10-CM

## 2018-10-05 DIAGNOSIS — E1165 Type 2 diabetes mellitus with hyperglycemia: Secondary | ICD-10-CM

## 2018-10-05 DIAGNOSIS — F419 Anxiety disorder, unspecified: Secondary | ICD-10-CM

## 2018-10-05 MED ORDER — SIMVASTATIN 40 MG PO TABS: ORAL_TABLET | ORAL | 4 refills | Status: DC

## 2018-10-05 NOTE — Progress Notes (Signed)
BP 110/60 (BP Location: Left Arm, Patient Position: Sitting, Cuff Size: Normal)   Pulse 93   Temp 97.7 F (36.5 C)   Ht 5\' 2"  (1.575 m)   Wt 140 lb 12 oz (63.8 kg)   SpO2 99%   BMI 25.74 kg/m    Subjective:    Patient ID: William [name], male    DOB: 11/05/1951, 67 y.o.   MRN: [id number]  HPI: William [name] is a 67 y.o. male presenting on 10/05/2018 for Diabetes and Hyperlipidemia   HPI   Pt is taking old simvastatin rx that is only 20mg .  His rx had been increased to 40mg .  Pt's Insulin was increased to 20units bid at last OV but pt is still using 16u.    Pt c/o anxiety.  pt states he gets episodes of feeling sob, feeling "desperate",  dizziness lasting about 2 minutes around 11am - 2pm during the day. pt states it happens once/day for the past 2 months. pt states he takes deep breaths which helps.  Pt denies chest pains.  No LOC.  No HA.    Relevant past medical, surgical, family and social history reviewed and updated as indicated. Interim medical history since our last visit reviewed. Allergies and medications reviewed and updated.   Current Outpatient Medications:  .  insulin NPH-regular Human (NOVOLIN 70/30) (70-30) 100 UNIT/ML injection, Inject 15 units into the skin twice daily with a meal.   inyecte 15 unidades a la piel dos veces al dia con el alimento (Patient taking differently: Inject 16 Units into the skin 2 (two) times daily with a meal. Inject 15 units into the skin twice daily with a meal.   inyecte 15 unidades a la piel dos veces al dia con el alimento), Disp: 10 mL, Rfl: 4 .  metFORMIN (GLUCOPHAGE) 1000 MG tablet, 1 po bid.  Tome una tableta por boca dos veces diarias con comida, Disp: 60 tablet, Rfl: 4 .  nystatin cream (MYCOSTATIN), Apply thin film to affected areas bid.  aplique tira delgada a la area [employer] al dia, Disp: 30 g, Rfl: 0 .  simvastatin (ZOCOR) 20 MG tablet, Take 20 mg by mouth daily., Disp: , Rfl:  .  simvastatin (ZOCOR) 40 MG  tablet, 1 po qhs for cholesterol.  Tome una tableta por boca al dormir para [employer] production planner (Patient not taking: Reported on 10/05/2018), Disp: 30 tablet, Rfl: 4   Review of Systems  Constitutional: Negative for appetite change, chills, diaphoresis, fatigue, fever and unexpected weight change.  HENT: Negative for congestion, dental problem, drooling, ear pain, facial swelling, hearing loss, mouth sores, sneezing, sore throat, trouble swallowing and voice change.   Eyes: Negative for pain, discharge, redness, itching and visual disturbance.  Respiratory: Negative for cough, choking, shortness of breath and wheezing.   Cardiovascular: Negative for chest pain, palpitations and leg swelling.  Gastrointestinal: Negative for abdominal pain, blood in stool, constipation, diarrhea and vomiting.  Endocrine: Negative for cold intolerance, heat intolerance and polydipsia.  Genitourinary: Negative for decreased urine volume, dysuria and hematuria.  Musculoskeletal: Negative for arthralgias, back pain and gait problem.  Skin: Negative for rash.  Allergic/Immunologic: Negative for environmental allergies.  Neurological: Negative for seizures, syncope, light-headedness and headaches.  Hematological: Negative for adenopathy.  Psychiatric/Behavioral: Negative for agitation, dysphoric mood and suicidal ideas. The patient is not nervous/anxious.     Per HPI unless specifically indicated above     Objective:    BP 110/60 (BP Location: Left Arm, Patient Position:  Sitting, Cuff Size: Normal)   Pulse 93   Temp 97.7 F (36.5 C)   Ht 5\' 2"  (1.575 m)   Wt 140 lb 12 oz (63.8 kg)   SpO2 99%   BMI 25.74 kg/m   Wt Readings from Last 3 Encounters:  10/05/18 140 lb 12 oz (63.8 kg)  07/05/18 145 lb 8 oz (66 kg)  04/18/18 141 lb 8 oz (64.2 kg)    Physical Exam  Constitutional: He is oriented to person, place, and time. He appears well-developed and well-nourished.  HENT:  Head: Normocephalic and atraumatic.   Neck: Neck supple.  Cardiovascular: Normal rate and regular rhythm.  Pulmonary/Chest: Effort normal and breath sounds normal. He has no wheezes.  Abdominal: Soft. Bowel sounds are normal. There is no hepatosplenomegaly. There is no tenderness.  Musculoskeletal: He exhibits no edema.  Lymphadenopathy:    He has no cervical adenopathy.  Neurological: He is alert and oriented to person, place, and time.  Skin: Skin is warm and dry.  Psychiatric: He has a normal mood and affect. His behavior is normal.  Vitals reviewed.   Results for orders placed or performed during the hospital encounter of 09/30/18  Lipid panel  Result Value Ref Range   Cholesterol 209 (H) 0 - 200 mg/dL   Triglycerides 96 <132 mg/dL   HDL 56 >44 mg/dL   Total CHOL/HDL Ratio 3.7 RATIO   VLDL 19 0 - 40 mg/dL   LDL Cholesterol 010 (H) 0 - 99 mg/dL  Comprehensive metabolic panel  Result Value Ref Range   Sodium 139 135 - 145 mmol/L   Potassium 4.6 3.5 - 5.1 mmol/L   Chloride 102 98 - 111 mmol/L   CO2 29 22 - 32 mmol/L   Glucose, Bld 208 (H) 70 - 99 mg/dL   BUN 17 8 - 23 mg/dL   Creatinine, Ser 2.72 0.61 - 1.24 mg/dL   Calcium 9.4 8.9 - 53.6 mg/dL   Total Protein 7.9 6.5 - 8.1 g/dL   Albumin 4.5 3.5 - 5.0 g/dL   AST 21 15 - 41 U/L   ALT 20 0 - 44 U/L   Alkaline Phosphatase 115 38 - 126 U/L   Total Bilirubin 0.7 0.3 - 1.2 mg/dL   GFR calc non Af Amer >60 >60 mL/min   GFR calc Af Amer >60 >60 mL/min   Anion gap 8 5 - 15  Hemoglobin A1c  Result Value Ref Range   Hgb A1c MFr Bld 7.8 (H) 4.8 - 5.6 %   Mean Plasma Glucose 177.16 mg/dL   EKG- sinus rhythm with no changes compared with EKG 03/22/14     Assessment & Plan:   Encounter Diagnoses  Name Primary?  Marland Kitchen Uncontrolled type 2 diabetes mellitus with complication (HCC) Yes  . Hyperlipidemia, unspecified hyperlipidemia type   . SOB (shortness of breath)   . Anxiety     -reviewed labs with pt -Counseled pt to take the 40mg  simvastatin.   -counseled pt  to Increase insulin to 20u bid.  He is reminded to call office for fbs < 70 or > 300 -Discussed trial of medication (SSRI) to help with anxiety- he does not want to take more medication and declines treatment -will follow up with pt in 1 month to check on bs with increased insulin and recheck anxiety.  Pt counseled to RTO if his symptoms worsen or if he develops new symptoms

## 2018-10-15 ENCOUNTER — Other Ambulatory Visit: Payer: Self-pay | Admitting: Physician Assistant

## 2018-10-15 MED ORDER — METFORMIN HCL 1000 MG PO TABS: ORAL_TABLET | ORAL | 2 refills | Status: AC

## 2018-11-08 ENCOUNTER — Encounter: Payer: Self-pay | Admitting: Physician Assistant

## 2018-11-08 ENCOUNTER — Ambulatory Visit: Payer: Self-pay | Admitting: Physician Assistant

## 2018-11-08 VITALS — BP 124/72 | HR 93 | Temp 97.5°F | Ht 62.0 in | Wt 145.5 lb

## 2018-11-08 DIAGNOSIS — E785 Hyperlipidemia, unspecified: Secondary | ICD-10-CM

## 2018-11-08 DIAGNOSIS — E118 Type 2 diabetes mellitus with unspecified complications: Principal | ICD-10-CM

## 2018-11-08 DIAGNOSIS — E1165 Type 2 diabetes mellitus with hyperglycemia: Secondary | ICD-10-CM

## 2018-11-08 DIAGNOSIS — IMO0002 Reserved for concepts with insufficient information to code with codable children: Secondary | ICD-10-CM

## 2018-11-08 NOTE — Progress Notes (Signed)
BP 124/72   Pulse 93   Temp (!) 97.5 F (36.4 C)   Ht 5\' 2"  (1.575 m)   Wt 145 lb 8 oz (66 kg)   SpO2 99%   BMI 26.61 kg/m    Subjective:    Patient ID: William [name], male    DOB: May 20, 1951, 67 y.o.   MRN: [id number]  HPI: William [name] is a 67 y.o. male presenting on 11/08/2018 for Diabetes and Mental Health Problem   HPI   Pt is feeling great today.  He says he is no longer having anxiety episodes.   Pt has been monitoring his bs.  bs log range  112-190  Relevant past medical, surgical, family and social history reviewed and updated as indicated. Interim medical history since our last visit reviewed. Allergies and medications reviewed and updated.   Current Outpatient Medications:  .  insulin NPH-regular Human (NOVOLIN 70/30) (70-30) 100 UNIT/ML injection, Inject 15 units into the skin twice daily with a meal.   inyecte 15 unidades a la piel dos veces al dia con el alimento (Patient taking differently: Inject 20 Units into the skin 2 (two) times daily with a meal. Inject 20 units into the skin twice daily with a meal.   inyecte 20 unidades a la piel dos veces al dia con el alimento), Disp: 10 mL, Rfl: 4 .  metFORMIN (GLUCOPHAGE) 1000 MG tablet, 1 po bid.  Tome una tableta por boca dos veces diarias con comida, Disp: 60 tablet, Rfl: 2 .  nystatin cream (MYCOSTATIN), Apply thin film to affected areas bid.  aplique tira delgada a la area [employer] al dia, Disp: 30 g, Rfl: 0 .  simvastatin (ZOCOR) 40 MG tablet, 1 po qhs for cholesterol.  Tome una tableta por boca al dormir para Print production planner, Disp: 30 tablet, Rfl: 4    Review of Systems  Constitutional: Negative for appetite change, chills, diaphoresis, fatigue, fever and unexpected weight change.  HENT: Negative for congestion, dental problem, drooling, ear pain, facial swelling, hearing loss, mouth sores, sneezing, sore throat, trouble swallowing and voice change.   Eyes: Negative for pain, discharge, redness,  itching and visual disturbance.  Respiratory: Negative for cough, choking, shortness of breath and wheezing.   Cardiovascular: Negative for chest pain, palpitations and leg swelling.  Gastrointestinal: Negative for abdominal pain, blood in stool, constipation, diarrhea and vomiting.  Endocrine: Negative for cold intolerance, heat intolerance and polydipsia.  Genitourinary: Negative for decreased urine volume, dysuria and hematuria.  Musculoskeletal: Negative for arthralgias, back pain and gait problem.  Skin: Negative for rash.  Allergic/Immunologic: Negative for environmental allergies.  Neurological: Negative for seizures, syncope, light-headedness and headaches.  Hematological: Negative for adenopathy.  Psychiatric/Behavioral: Negative for agitation, dysphoric mood and suicidal ideas. The patient is not nervous/anxious.     Per HPI unless specifically indicated above     Objective:    BP 124/72   Pulse 93   Temp (!) 97.5 F (36.4 C)   Ht 5\' 2"  (1.575 m)   Wt 145 lb 8 oz (66 kg)   SpO2 99%   BMI 26.61 kg/m   Wt Readings from Last 3 Encounters:  11/08/18 145 lb 8 oz (66 kg)  10/05/18 140 lb 12 oz (63.8 kg)  07/05/18 145 lb 8 oz (66 kg)    Physical Exam  Constitutional: He is oriented to person, place, and time. He appears well-developed and well-nourished.  HENT:  Head: Normocephalic and atraumatic.  Neck: Neck supple.  Cardiovascular:  Normal rate and regular rhythm.  Pulmonary/Chest: Effort normal and breath sounds normal. He has no wheezes.  Abdominal: Soft. Bowel sounds are normal. There is no hepatosplenomegaly. There is no tenderness.  Musculoskeletal: He exhibits no edema.  Lymphadenopathy:    He has no cervical adenopathy.  Neurological: He is alert and oriented to person, place, and time.  Skin: Skin is warm and dry.  Psychiatric: He has a normal mood and affect. His behavior is normal.  Vitals reviewed.       Assessment & Plan:   Encounter Diagnoses   Name Primary?  Marland Kitchen. Uncontrolled type 2 diabetes mellitus with complication (HCC) Yes  . Hyperlipidemia, unspecified hyperlipidemia type      Pt turning 67 later this month- Pt to get new PCP.  He is encouraged to plan to follow up around February.  Pt states understanding

## 2019-05-06 ENCOUNTER — Other Ambulatory Visit: Payer: Self-pay | Admitting: Physician Assistant

## 2020-01-16 ENCOUNTER — Other Ambulatory Visit: Payer: Self-pay

## 2020-01-16 ENCOUNTER — Ambulatory Visit: Payer: Self-pay | Attending: Internal Medicine

## 2020-01-16 DIAGNOSIS — U071 COVID-19: Secondary | ICD-10-CM | POA: Insufficient documentation

## 2020-01-16 DIAGNOSIS — Z20822 Contact with and (suspected) exposure to covid-19: Secondary | ICD-10-CM

## 2020-01-17 LAB — NOVEL CORONAVIRUS, NAA: SARS-CoV-2, NAA: DETECTED — AB

## 2020-01-18 ENCOUNTER — Telehealth: Payer: Self-pay | Admitting: Nurse Practitioner

## 2020-01-18 NOTE — Telephone Encounter (Signed)
Called to discuss with William [name] about Covid symptoms and the use of bamlanivimab, a monoclonal antibody infusion for those with mild to moderate Covid symptoms and at a high risk of hospitalization.     Pt is qualified for this infusion at the [facility] infusion center due to co-morbid conditions and/or a member of an at-risk group.  Unable to reach patient. Voicemail left.    Patient Active Problem List   Diagnosis Date Noted  . Elevated LFTs 04/29/2017  . Hyperlipidemia 04/29/2017  . Uncontrolled type 2 diabetes mellitus with complication (HCC) 04/15/2017    Willette Alma, AGPCNP-BC Pager: [phone] Amion: N. Cousar

## 2020-01-19 ENCOUNTER — Telehealth: Payer: Self-pay

## 2020-01-19 NOTE — Telephone Encounter (Signed)
Patient called with help of Tiburcio Pea. He gave verbal permission for me to give Ms Trisha Mangle his COVID-19 test result. Ms Trisha Mangle was informed that Mr Borquez test for COVID-19 01/16/20 was positive and he can pass the germ to others. She states he has cough. Symptom tiers and treatment plans for each were read to Ms Trisha Mangle.  Criteria for ending isolation were read to Ms Trisha Mangle. Good preventative practice were discussed. Ms. Trisha Mangle was advised that everyone living in the home would now have to quarantine and stay home 14 day. They should monitor for symptoms and if symptoms develop go to be tested for COVID-19. She verbalized understanding of all information. Per previous notation HD has been notified.

## 2020-01-24 ENCOUNTER — Inpatient Hospital Stay (HOSPITAL_COMMUNITY)
Admission: EM | Admit: 2020-01-24 | Discharge: 2020-01-30 | DRG: 177 | Disposition: A | Discharge status: Home / Self Care | Payer: HRSA Program | Attending: Internal Medicine | Admitting: Internal Medicine

## 2020-01-24 ENCOUNTER — Encounter (HOSPITAL_COMMUNITY): Payer: Self-pay | Admitting: Emergency Medicine

## 2020-01-24 ENCOUNTER — Emergency Department (HOSPITAL_COMMUNITY): Payer: HRSA Program

## 2020-01-24 ENCOUNTER — Other Ambulatory Visit: Payer: Self-pay

## 2020-01-24 DIAGNOSIS — T380X5A Adverse effect of glucocorticoids and synthetic analogues, initial encounter: Secondary | ICD-10-CM | POA: Diagnosis not present

## 2020-01-24 DIAGNOSIS — J9601 Acute respiratory failure with hypoxia: Secondary | ICD-10-CM | POA: Diagnosis present

## 2020-01-24 DIAGNOSIS — E785 Hyperlipidemia, unspecified: Secondary | ICD-10-CM | POA: Diagnosis present

## 2020-01-24 DIAGNOSIS — U071 COVID-19: Principal | ICD-10-CM | POA: Diagnosis present

## 2020-01-24 DIAGNOSIS — E872 Acidosis: Secondary | ICD-10-CM | POA: Diagnosis present

## 2020-01-24 DIAGNOSIS — Z833 Family history of diabetes mellitus: Secondary | ICD-10-CM

## 2020-01-24 DIAGNOSIS — Z789 Other specified health status: Secondary | ICD-10-CM | POA: Diagnosis not present

## 2020-01-24 DIAGNOSIS — Z87891 Personal history of nicotine dependence: Secondary | ICD-10-CM

## 2020-01-24 DIAGNOSIS — Z794 Long term (current) use of insulin: Secondary | ICD-10-CM | POA: Diagnosis not present

## 2020-01-24 DIAGNOSIS — E1165 Type 2 diabetes mellitus with hyperglycemia: Secondary | ICD-10-CM | POA: Diagnosis present

## 2020-01-24 DIAGNOSIS — R0902 Hypoxemia: Secondary | ICD-10-CM | POA: Diagnosis not present

## 2020-01-24 DIAGNOSIS — J1282 Pneumonia due to coronavirus disease 2019: Secondary | ICD-10-CM | POA: Diagnosis present

## 2020-01-24 DIAGNOSIS — E118 Type 2 diabetes mellitus with unspecified complications: Secondary | ICD-10-CM

## 2020-01-24 DIAGNOSIS — IMO0002 Reserved for concepts with insufficient information to code with codable children: Secondary | ICD-10-CM | POA: Diagnosis present

## 2020-01-24 LAB — COMPREHENSIVE METABOLIC PANEL
ALT: 17 U/L (ref 0–44)
AST: 27 U/L (ref 15–41)
Albumin: 3.5 g/dL (ref 3.5–5.0)
Alkaline Phosphatase: 82 U/L (ref 38–126)
Anion gap: 14 (ref 5–15)
BUN: 39 mg/dL — ABNORMAL HIGH (ref 8–23)
CO2: 28 mmol/L (ref 22–32)
Calcium: 8.5 mg/dL — ABNORMAL LOW (ref 8.9–10.3)
Chloride: 92 mmol/L — ABNORMAL LOW (ref 98–111)
Creatinine, Ser: 1.24 mg/dL (ref 0.61–1.24)
GFR calc Af Amer: >60 mL/min (ref >60)
GFR calc non Af Amer: >60 mL/min (ref >60)
Glucose, Bld: 176 mg/dL — ABNORMAL HIGH (ref 70–99)
Potassium: 3.9 mmol/L (ref 3.5–5.1)
Sodium: 134 mmol/L — ABNORMAL LOW (ref 135–145)
Total Bilirubin: 1 mg/dL (ref 0.3–1.2)
Total Protein: 8 g/dL (ref 6.5–8.1)

## 2020-01-24 LAB — CBC WITH DIFFERENTIAL/PLATELET
Abs Immature Granulocytes: 0.09 10*3/uL — ABNORMAL HIGH (ref 0.00–0.07)
Basophils Absolute: 0 10*3/uL (ref 0.0–0.1)
Basophils Relative: 0 %
Eosinophils Absolute: 0 10*3/uL (ref 0.0–0.5)
Eosinophils Relative: 0 %
HCT: 41.1 % (ref 39.0–52.0)
Hemoglobin: 13.6 g/dL (ref 13.0–17.0)
Immature Granulocytes: 1 %
Lymphocytes Relative: 8 %
Lymphs Abs: 1.1 10*3/uL (ref 0.7–4.0)
MCH: 29.6 pg (ref 26.0–34.0)
MCHC: 33.1 g/dL (ref 30.0–36.0)
MCV: 89.5 fL (ref 80.0–100.0)
Monocytes Absolute: 0.5 10*3/uL (ref 0.1–1.0)
Monocytes Relative: 3 %
Neutro Abs: 12.3 10*3/uL — ABNORMAL HIGH (ref 1.7–7.7)
Neutrophils Relative %: 88 %
Platelets: 340 10*3/uL (ref 150–400)
RBC: 4.59 MIL/uL (ref 4.22–5.81)
RDW: 12.2 % (ref 11.5–15.5)
WBC: 14 10*3/uL — ABNORMAL HIGH (ref 4.0–10.5)
nRBC: 0 % (ref 0.0–0.2)

## 2020-01-24 LAB — GLUCOSE, CAPILLARY: Glucose-Capillary: 394 mg/dL — ABNORMAL HIGH (ref 70–99)

## 2020-01-24 LAB — LACTATE DEHYDROGENASE: LDH: 477 U/L — ABNORMAL HIGH (ref 98–192)

## 2020-01-24 LAB — PROCALCITONIN: Procalcitonin: 1.12 ng/mL

## 2020-01-24 LAB — FERRITIN: Ferritin: 762 ng/mL — ABNORMAL HIGH (ref 24–336)

## 2020-01-24 LAB — D-DIMER, QUANTITATIVE: D-Dimer, Quant: 3.85 ug/mL-FEU — ABNORMAL HIGH (ref 0.00–0.50)

## 2020-01-24 LAB — CBG MONITORING, ED: Glucose-Capillary: 231 mg/dL — ABNORMAL HIGH (ref 70–99)

## 2020-01-24 LAB — POC SARS CORONAVIRUS 2 AG -  ED: SARS Coronavirus 2 Ag: POSITIVE — AB

## 2020-01-24 LAB — C-REACTIVE PROTEIN: CRP: 14.5 mg/dL — ABNORMAL HIGH (ref <1.0)

## 2020-01-24 LAB — LACTIC ACID, PLASMA
Lactic Acid, Venous: 3.2 mmol/L (ref 0.5–1.9)
Lactic Acid, Venous: 1.9 mmol/L (ref 0.5–1.9)

## 2020-01-24 LAB — HEMOGLOBIN A1C
Hgb A1c MFr Bld: 12.2 % — ABNORMAL HIGH (ref 4.8–5.6)
Mean Plasma Glucose: 303.44 mg/dL

## 2020-01-24 LAB — FIBRINOGEN: Fibrinogen: 697 mg/dL — ABNORMAL HIGH (ref 210–475)

## 2020-01-24 LAB — TRIGLYCERIDES: Triglycerides: 193 mg/dL — ABNORMAL HIGH (ref <150)

## 2020-01-24 MED ORDER — INSULIN ASPART 100 UNIT/ML ~~LOC~~ SOLN
0.0000 [IU] | Freq: Three times a day (TID) | SUBCUTANEOUS | Status: DC
  Administered 2020-01-24: 7 [IU] via SUBCUTANEOUS
  Administered 2020-01-25: 4 [IU] via SUBCUTANEOUS
  Administered 2020-01-25: 11 [IU] via SUBCUTANEOUS
  Administered 2020-01-25: 7 [IU] via SUBCUTANEOUS
  Administered 2020-01-26: 4 [IU] via SUBCUTANEOUS
  Administered 2020-01-26: 20 [IU] via SUBCUTANEOUS
  Administered 2020-01-26: 15 [IU] via SUBCUTANEOUS
  Administered 2020-01-27: 4 [IU] via SUBCUTANEOUS
  Administered 2020-01-27 – 2020-01-28 (×3): 20 [IU] via SUBCUTANEOUS
  Administered 2020-01-28: 7 [IU] via SUBCUTANEOUS
  Administered 2020-01-28: 20 [IU] via SUBCUTANEOUS
  Administered 2020-01-29: 15 [IU] via SUBCUTANEOUS
  Administered 2020-01-29: 11 [IU] via SUBCUTANEOUS
  Administered 2020-01-29: 20 [IU] via SUBCUTANEOUS
  Administered 2020-01-30 (×3): 7 [IU] via SUBCUTANEOUS
  Filled 2020-01-24: qty 1

## 2020-01-24 MED ORDER — FOLIC ACID 1 MG PO TABS
1.0000 mg | ORAL_TABLET | Freq: Every day | ORAL | Status: DC
  Administered 2020-01-24 – 2020-01-30 (×7): 1 mg via ORAL
  Filled 2020-01-24 (×7): qty 1

## 2020-01-24 MED ORDER — THIAMINE HCL 100 MG PO TABS
100.0000 mg | ORAL_TABLET | Freq: Every day | ORAL | Status: DC
  Administered 2020-01-24 – 2020-01-30 (×7): 100 mg via ORAL
  Filled 2020-01-24 (×7): qty 1

## 2020-01-24 MED ORDER — ADULT MULTIVITAMIN W/MINERALS CH
1.0000 | ORAL_TABLET | Freq: Every day | ORAL | Status: DC
  Administered 2020-01-24 – 2020-01-30 (×7): 1 via ORAL
  Filled 2020-01-24 (×7): qty 1

## 2020-01-24 MED ORDER — GUAIFENESIN ER 600 MG PO TB12
600.0000 mg | ORAL_TABLET | Freq: Two times a day (BID) | ORAL | Status: DC
  Administered 2020-01-24 – 2020-01-30 (×12): 600 mg via ORAL
  Filled 2020-01-24 (×12): qty 1

## 2020-01-24 MED ORDER — SODIUM CHLORIDE 0.9 % IV BOLUS
500.0000 mL | Freq: Once | INTRAVENOUS | Status: AC
  Administered 2020-01-24: 500 mL via INTRAVENOUS

## 2020-01-24 MED ORDER — DEXAMETHASONE SODIUM PHOSPHATE 10 MG/ML IJ SOLN
6.0000 mg | INTRAMUSCULAR | Status: DC
  Filled 2020-01-24 (×2): qty 1

## 2020-01-24 MED ORDER — DEXAMETHASONE SODIUM PHOSPHATE 10 MG/ML IJ SOLN
6.0000 mg | Freq: Once | INTRAMUSCULAR | Status: AC
  Administered 2020-01-24: 6 mg via INTRAVENOUS
  Filled 2020-01-24: qty 1

## 2020-01-24 MED ORDER — SODIUM CHLORIDE 0.9 % IV SOLN
500.0000 mg | INTRAVENOUS | Status: DC
  Administered 2020-01-24 – 2020-01-26 (×3): 500 mg via INTRAVENOUS
  Filled 2020-01-24 (×3): qty 500

## 2020-01-24 MED ORDER — SODIUM CHLORIDE 0.9 % IV SOLN
1.0000 g | INTRAVENOUS | Status: DC
  Administered 2020-01-24 – 2020-01-28 (×5): 1 g via INTRAVENOUS
  Filled 2020-01-24 (×6): qty 10

## 2020-01-24 MED ORDER — ENOXAPARIN SODIUM 40 MG/0.4ML ~~LOC~~ SOLN
40.0000 mg | SUBCUTANEOUS | Status: DC
  Administered 2020-01-24 – 2020-01-30 (×7): 40 mg via SUBCUTANEOUS
  Filled 2020-01-24 (×7): qty 0.4

## 2020-01-24 MED ORDER — SODIUM CHLORIDE 0.9 % IV SOLN: INTRAVENOUS | Status: AC

## 2020-01-24 MED ORDER — INSULIN ASPART 100 UNIT/ML ~~LOC~~ SOLN
0.0000 [IU] | Freq: Every day | SUBCUTANEOUS | Status: DC
  Administered 2020-01-24: 5 [IU] via SUBCUTANEOUS
  Administered 2020-01-26: 3 [IU] via SUBCUTANEOUS
  Administered 2020-01-27: 5 [IU] via SUBCUTANEOUS
  Administered 2020-01-28: 3 [IU] via SUBCUTANEOUS
  Administered 2020-01-29: 2 [IU] via SUBCUTANEOUS

## 2020-01-24 MED ORDER — ASCORBIC ACID 500 MG PO TABS
500.0000 mg | ORAL_TABLET | Freq: Every day | ORAL | Status: DC
  Administered 2020-01-24 – 2020-01-30 (×7): 500 mg via ORAL
  Filled 2020-01-24 (×7): qty 1

## 2020-01-24 MED ORDER — HYDROCOD POLST-CPM POLST ER 10-8 MG/5ML PO SUER
5.0000 mL | Freq: Two times a day (BID) | ORAL | Status: DC | PRN
  Administered 2020-01-27: 5 mL via ORAL
  Filled 2020-01-24: qty 5

## 2020-01-24 MED ORDER — SODIUM CHLORIDE 0.9 % IV SOLN
100.0000 mg | Freq: Every day | INTRAVENOUS | Status: AC
  Administered 2020-01-25 – 2020-01-28 (×4): 100 mg via INTRAVENOUS
  Filled 2020-01-24 (×5): qty 20

## 2020-01-24 MED ORDER — ALBUTEROL SULFATE HFA 108 (90 BASE) MCG/ACT IN AERS
2.0000 | INHALATION_SPRAY | Freq: Four times a day (QID) | RESPIRATORY_TRACT | Status: DC
  Administered 2020-01-24 – 2020-01-30 (×23): 2 via RESPIRATORY_TRACT
  Filled 2020-01-24 (×2): qty 6.7

## 2020-01-24 MED ORDER — SODIUM CHLORIDE 0.9 % IV SOLN
200.0000 mg | Freq: Once | INTRAVENOUS | Status: AC
  Administered 2020-01-24: 200 mg via INTRAVENOUS
  Filled 2020-01-24 (×2): qty 40

## 2020-01-24 MED ORDER — GUAIFENESIN-DM 100-10 MG/5ML PO SYRP
10.0000 mL | ORAL_SOLUTION | ORAL | Status: DC | PRN
  Administered 2020-01-29: 10 mL via ORAL
  Filled 2020-01-24: qty 10

## 2020-01-24 MED ORDER — DEXAMETHASONE SODIUM PHOSPHATE 10 MG/ML IJ SOLN: 6.0000 mg | INTRAMUSCULAR | Status: DC

## 2020-01-24 MED ORDER — ZINC SULFATE 220 (50 ZN) MG PO CAPS
220.0000 mg | ORAL_CAPSULE | Freq: Every day | ORAL | Status: DC
  Administered 2020-01-24 – 2020-01-30 (×7): 220 mg via ORAL
  Filled 2020-01-24 (×7): qty 1

## 2020-01-24 NOTE — ED Notes (Signed)
Called [facility] for transport to [place]. 

## 2020-01-24 NOTE — ED Triage Notes (Signed)
Patient states he tested positive for COVID 2 weeks ago and continues to have generalized malaise. Denies shortness of breath. States cough, fever, and chills. Pt states he is a diabetic. NAD

## 2020-01-24 NOTE — H&P (Signed)
Patient Demographics:    William [name], is a 69 y.o. male  MRN: [id number]   DOB - 05-05-1951  Admit Date - 01/24/2020  Outpatient Primary MD for the patient is Patient, No Pcp Per   Assessment & Plan:    Active Problems:   Pneumonia due to COVID-19 virus   Acute respiratory failure with hypoxia Due to Covid PNA   Uncontrolled type 2 diabetes mellitus with complication (HCC)   Language barrier/Spanish only   Hyperlipidemia   1)Acute hypoxic respiratory failure secondary to COVID-19 infection/pneumonia---  -O2 sats in the ED with 78% on room air, patient currently requiring 4 L of oxygen via nasal cannula --Patient is positive for COVID-19 infection, chest x-ray with findings of infiltrates/opacities,  patient is hypoxic and requiring continuous supplemental oxygen---patient meets criteria for initiation of Remdesivir AND Steroid therapy per protocol  -Elevated inflammatory markers noted including -CRP 14.5, ferritin 762, fibrinogen 697, LDH 477, D-dimer 3.85  -Cannot exclude bacterial pneumonia given findings of WBC of 14.0, with absolute neutrophil of 12.3, procalcitonin is 1.12, as well as Lactic acid of 3.2 , down to 1.9 after hydration---we will treat empirically with Rocephin and azithromycin for possible concomitant bacterial pneumonia, trend procalcitonin, follow WBC and CRP, -Creatinine is 1.24, bicarb is 28 and anion gap is 14 --Check and trend daily fibrinogen, CRP, pro calcitonin, CBC, BMP, d-dimer, LDH, ferritin and LFTs --Supplemental oxygen to keep O2 sats above 93% -Follow serial chest x-rays and ABGs as indicated --- Encourage prone positioning for More than 16 hours/day in increments of 2 to 3 hours at a time if able to tolerate --Attempt to maintain euvolemic state --Zinc and vitamin C as  ordered -Albuterol inhaler as needed   2)DM2-last A1c 7.8 reflecting uncontrolled DM -Anticipate worsening glycemic control with steroids -Hold Metformin due to lactic acidosis, continue 70/30 insulin, Use Novolog/Humalog Sliding scale insulin with Accu-Cheks/Fingersticks as ordered   3) social/ethics--- language barrier noted, patient mostly Spanish-speaking, interpreter utilized, I called and spoke with patient's daughter Ms Delta Deshmukh [phone], questions answered -Patient is a full code   NB!!  DVT prophylaxis with Lovenox 1 mg/kg every 24 hours, consider CTA chest to rule out PE if patient fails to respond to treatment as anticipated, especially given very elevated D-dimer which may be secondary to COVID-19/inflammatory marker Versus VTE    With History of - Reviewed by me  Past Medical History:  Diagnosis Date  . Diabetes mellitus without complication ([place])   . Hyperlipidemia       Past Surgical History:  Procedure Laterality Date  . APPENDECTOMY      Chief Complaint  Patient presents with  . Generalized Body Aches    COVID +      HPI:    William [name]  is a 69 y.o. male mostly Spanish-speaking with past medical history relevant for DM2, HLD who presents with cough, chills, dyspnea and found to have hypoxia with O2 sats of  78% on room air in the ED -Required 4 L of oxygen to get his O2 sats above 90%  language barrier noted, patient mostly Spanish-speaking, interpreter utilized, I called and spoke with patient's daughter Ms Damain Broadus [phone], questions answered  -Patient's daughter apparently tested positive for Covid and was symptomatic around 01/09/2020, patient started feeling sick around 01/17/2020 -Patient's cough, respiratory symptoms and chills worsened over the last 24 hours  -Elevated inflammatory markers noted including -CRP 14.5, ferritin 762, fibrinogen 697, LDH 477, D-dimer 3.85 ,  Triglycerides  193 - WBC of 14.0, with absolute  neutrophil of 12.3, procalcitonin is 1.12, as well as Lactic acid of 3.2 , down to 1.9 after hydration--- -Creatinine is 1.24, bicarb is 28 and anion gap is 14 --- Chest x-ray consistent with pneumonia  -No vomiting or diarrhea    Review of systems:    In addition to the HPI above,   A full Review of  Systems was done, all other systems reviewed are negative except as noted above in HPI , .    Social History:  Reviewed by me    Social History   Tobacco Use  . Smoking status: Former Smoker    Quit date: 12/07/1996    Years since quitting: 23.1  . Smokeless tobacco: Never Used  Substance Use Topics  . Alcohol use: No     Family History :  Reviewed by me    Family History  Problem Relation Age of Onset  . Diabetes Mother   . Diabetes Father      Home Medications:   Prior to Admission medications   Medication Sig Start Date End Date Taking? Authorizing Provider  insulin NPH-regular Human (NOVOLIN 70/30) (70-30) 100 UNIT/ML injection Inject 15 units into the skin twice daily with a meal.   inyecte 15 unidades a la piel dos veces al dia con el alimento Patient taking differently: Inject 20 Units into the skin 2 (two) times daily with a meal. Inject 20 units into the skin twice daily with a meal.   inyecte 20 unidades a la [employer] veces al dia con el alimento 01/04/18   Jacquelin Hawking, PA-C  metFORMIN (GLUCOPHAGE) 1000 MG tablet 1 po bid.  Tome una tableta por [place] veces diarias con comida 10/15/18   Jacquelin Hawking, PA-C  nystatin cream (MYCOSTATIN) Apply thin film to affected areas bid.  aplique tira delgada a la area [employer] al dia 08/12/17   Jacquelin Hawking, PA-C  simvastatin (ZOCOR) 40 MG tablet 1 po qhs for cholesterol.  Tome una tableta por boca al dormir para el colesterol 10/05/18   Jacquelin Hawking, PA-C     Allergies:    No Known Allergies   Physical Exam:   Vitals  Blood pressure 108/63, pulse 83, temperature 99.6 F (37.6 C), temperature  source Oral, resp. rate 19, height 5\' 2"  (1.575 m), weight 63.5 kg, SpO2 97 %.  Physical Examination: General appearance - alert, well appearing, and in no distress  Nose- [place] 4L/min Mental status - alert, oriented to person, place, and time,  Eyes - sclera anicteric Neck - supple, no JVD elevation , Chest -diminished in bases, few scattered rhonchi,  heart - S1 and S2 normal, regular  Abdomen - soft, nontender, nondistended, no masses or organomegaly Neurological - screening mental status exam normal, neck supple without rigidity, cranial nerves II through XII intact, DTR's normal and symmetric Extremities - no pedal edema noted, intact peripheral pulses  Skin - warm, dry  Data Review:    CBC Recent Labs  Lab 01/24/20 0938  WBC 14.0*  HGB 13.6  HCT 41.1  PLT 340  MCV 89.5  MCH 29.6  MCHC 33.1  RDW 12.2  LYMPHSABS 1.1  MONOABS 0.5  EOSABS 0.0  BASOSABS 0.0   ------------------------------------------------------------------------------------------------------------------  Chemistries  Recent Labs  Lab 01/24/20 0938  NA 134*  K 3.9  CL 92*  CO2 28  GLUCOSE 176*  BUN 39*  CREATININE 1.24  CALCIUM 8.5*  AST 27  ALT 17  ALKPHOS 82  BILITOT 1.0   ------------------------------------------------------------------------------------------------------------------ estimated creatinine clearance is 45.3 mL/min (by C-G formula based on SCr of 1.24 mg/dL). ------------------------------------------------------------------------------------------------------------------ No results for input(s): TSH, T4TOTAL, T3FREE, THYROIDAB in the last 72 hours.  Invalid input(s): FREET3   Coagulation profile No results for input(s): INR, PROTIME in the last 168 hours. ------------------------------------------------------------------------------------------------------------------- Recent Labs    01/24/20 0938  DDIMER 3.85*   Cardiac Enzymes No results for input(s): CKMB,  TROPONINI, MYOGLOBIN in the last 168 hours.  Invalid input(s): CK ------------------------------------------------------------------------------------------------------------------ No results found for: BNP ---------------------------------------------------------------------------------------------------------------  Urinalysis    Component Value Date/Time   COLORURINE YELLOW 03/22/2014 1130   APPEARANCEUR CLEAR 03/22/2014 1130   LABSPEC 1.010 03/22/2014 1130   PHURINE 5.5 03/22/2014 1130   GLUCOSEU >1000 (A) 03/22/2014 1130   HGBUR NEGATIVE 03/22/2014 1130   BILIRUBINUR NEGATIVE 03/22/2014 1130   KETONESUR NEGATIVE 03/22/2014 1130   PROTEINUR NEGATIVE 03/22/2014 1130   UROBILINOGEN 0.2 03/22/2014 1130   NITRITE NEGATIVE 03/22/2014 1130   LEUKOCYTESUR NEGATIVE 03/22/2014 1130   ----------------------------------------------------------------------------------------------------------------   Imaging Results:    DG Chest Port 1 View  Result Date: 01/24/2020 CLINICAL DATA:  History of COVID-19 positivity with persistent weakness and fatigue EXAM: PORTABLE CHEST 1 VIEW COMPARISON:  None. FINDINGS: Cardiac shadow is within normal limits. The lungs are well aerated bilaterally. Patchy opacities are identified bilaterally consistent with the given clinical history. No bony abnormality is seen. IMPRESSION: Patchy opacities consistent with the clinical history of COVID-19 positivity. Electronically Signed   By: Alcide Clever M.D.   On: 01/24/2020 09:36    Radiological Exams on Admission: DG Chest Port 1 View  Result Date: 01/24/2020 CLINICAL DATA:  History of COVID-19 positivity with persistent weakness and fatigue EXAM: PORTABLE CHEST 1 VIEW COMPARISON:  None. FINDINGS: Cardiac shadow is within normal limits. The lungs are well aerated bilaterally. Patchy opacities are identified bilaterally consistent with the given clinical history. No bony abnormality is seen. IMPRESSION: Patchy  opacities consistent with the clinical history of COVID-19 positivity. Electronically Signed   By: Alcide Clever M.D.   On: 01/24/2020 09:36   DVT Prophylaxis -SCD/Lovenox AM Labs Ordered, also please review Full Orders  Family Communication: Admission, patients condition and plan of care including tests being ordered have been discussed with the patient and daughter who indicate understanding and agree with the plan   Code Status - Full Code  Likely DC to  Home when hypoxia resolved/improved significantly  Condition ---fair  Shon Hale M.D on 01/24/2020 at 1:07 PM Go to [email or web address] -  for contact info  [facility] - Office  [phone] \

## 2020-01-24 NOTE — Plan of Care (Signed)
Patient resting in bed, 4.5L O2 [place], sats stable mid 90's Problem: Education: Goal: Knowledge of risk factors and measures for prevention of condition will improve Outcome: Progressing   Problem: Coping: Goal: Psychosocial and spiritual needs will be supported Outcome: Progressing   Problem: Respiratory: Goal: Will maintain a patent airway Outcome: Progressing Goal: Complications related to the disease process, condition or treatment will be avoided or minimized Outcome: Progressing

## 2020-01-24 NOTE — ED Notes (Signed)
Date and time results received: 01/24/20 0937 (use smartphrase ".now" to insert current time)  Test: lactic acid  Critical Value: 3.2  Name of Provider Notified: Bero MD  Orders Received? Or Actions Taken?: n/a

## 2020-01-24 NOTE — Progress Notes (Signed)
Ok to use standard dose Lovenox for DVT prophylaxis per Dr. Kirkland Hun, PharmD, BCIDP, AAHIVP, CPP Infectious Disease Pharmacist 01/24/2020 4:13 PM

## 2020-01-24 NOTE — ED Provider Notes (Signed)
[facility] Emergency Department Provider Note MRN:  [id number]  Arrival date & time: 01/24/20     Chief Complaint   Generalized Body Aches   History of Present Illness   William [name] is a 69 y.o. year-old male with a history of diabetes, hyperlipidemia presenting to the ED with chief complaint of body aches.  Patient is endorsing malaise, fatigue, chills, body aches, mild cough.  Tested positive for coronavirus on 2-9.  Denies shortness of breath, no chest pain, no abdominal pain, no vomiting, no diarrhea.  Review of Systems  A complete 10 system review of systems was obtained and all systems are negative except as noted in the HPI and PMH.   Patient's Health History    Past Medical History:  Diagnosis Date  . Diabetes mellitus without complication ([place])   . Hyperlipidemia     Past Surgical History:  Procedure Laterality Date  . APPENDECTOMY      Family History  Problem Relation Age of Onset  . Diabetes Mother   . Diabetes Father     Social History   Socioeconomic History  . Marital status: Married    Spouse name: Not on file  . Number of children: Not on file  . Years of education: Not on file  . Highest education level: Not on file  Occupational History  . Not on file  Tobacco Use  . Smoking status: Former Smoker    Quit date: 12/07/1996    Years since quitting: 23.1  . Smokeless tobacco: Never Used  Substance and Sexual Activity  . Alcohol use: No  . Drug use: No  . Sexual activity: Not on file  Other Topics Concern  . Not on file  Social History Narrative  . Not on file   Social Determinants of Health   Financial Resource Strain:   . Difficulty of Paying Living Expenses: Not on file  Food Insecurity:   . Worried About Charity fundraiser in the Last Year: Not on file  . Ran Out of Food in the Last Year: Not on file  Transportation Needs:   . Lack of Transportation (Medical): Not on file  . Lack of Transportation  (Non-Medical): Not on file  Physical Activity:   . Days of Exercise per Week: Not on file  . Minutes of Exercise per Session: Not on file  Stress:   . Feeling of Stress : Not on file  Social Connections:   . Frequency of Communication with Friends and Family: Not on file  . Frequency of Social Gatherings with Friends and Family: Not on file  . Attends Religious Services: Not on file  . Active Member of Clubs or Organizations: Not on file  . Attends Archivist Meetings: Not on file  . Marital Status: Not on file  Intimate Partner Violence:   . Fear of Current or Ex-Partner: Not on file  . Emotionally Abused: Not on file  . Physically Abused: Not on file  . Sexually Abused: Not on file     Physical Exam   Vitals:   01/24/20 1040 01/24/20 1045  BP: (!) 153/78   Pulse: (!) 105 (!) 105  Resp: (!) 27 15  Temp:    SpO2:  96%    CONSTITUTIONAL: Well-appearing, NAD NEURO:  Alert and oriented x 3, no focal deficits EYES:  eyes equal and reactive ENT/NECK:  no LAD, no JVD CARDIO: Tachycardic rate, well-perfused, normal S1 and S2 PULM:  CTAB no wheezing or rhonchi,  mild increased work of breathing GI/GU:  normal bowel sounds, non-distended, non-tender MSK/SPINE:  No gross deformities, no edema SKIN:  no rash, atraumatic PSYCH:  Appropriate speech and behavior  *Additional and/or pertinent findings included in MDM below  Diagnostic and Interventional Summary    EKG Interpretation  Date/Time:    Ventricular Rate:    PR Interval:    QRS Duration:   QT Interval:    QTC Calculation:   R Axis:     Text Interpretation:        Cardiac Monitoring Interpretation:  Labs Reviewed  LACTIC ACID, PLASMA - Abnormal; Notable for the following components:      Result Value   Lactic Acid, Venous 3.2 (*)    All other components within normal limits  CBC WITH DIFFERENTIAL/PLATELET - Abnormal; Notable for the following components:   WBC 14.0 (*)    Neutro Abs 12.3 (*)     Abs Immature Granulocytes 0.09 (*)    All other components within normal limits  COMPREHENSIVE METABOLIC PANEL - Abnormal; Notable for the following components:   Sodium 134 (*)    Chloride 92 (*)    Glucose, Bld 176 (*)    BUN 39 (*)    Calcium 8.5 (*)    All other components within normal limits  D-DIMER, QUANTITATIVE (NOT AT [facility]) - Abnormal; Notable for the following components:   D-Dimer, Quant 3.85 (*)    All other components within normal limits  LACTATE DEHYDROGENASE - Abnormal; Notable for the following components:   LDH 477 (*)    All other components within normal limits  FERRITIN - Abnormal; Notable for the following components:   Ferritin 762 (*)    All other components within normal limits  TRIGLYCERIDES - Abnormal; Notable for the following components:   Triglycerides 193 (*)    All other components within normal limits  FIBRINOGEN - Abnormal; Notable for the following components:   Fibrinogen 697 (*)    All other components within normal limits  C-REACTIVE PROTEIN - Abnormal; Notable for the following components:   CRP 14.5 (*)    All other components within normal limits  POC SARS CORONAVIRUS 2 AG -  ED - Abnormal; Notable for the following components:   SARS Coronavirus 2 Ag POSITIVE (*)    All other components within normal limits  CULTURE, BLOOD (ROUTINE X 2)  CULTURE, BLOOD (ROUTINE X 2)  PROCALCITONIN  LACTIC ACID, PLASMA    DG Chest Port 1 View  Final Result      Medications  dexamethasone (DECADRON) injection 6 mg (6 mg Intravenous Given 01/24/20 0904)  sodium chloride 0.9 % bolus 500 mL (500 mLs Intravenous New Bag/Given 01/24/20 1046)     Procedures  /  Critical Care .Critical Care Performed by: Sabas Sous, MD Authorized by: Sabas Sous, MD   Critical care provider statement:    Critical care time (minutes):  45   Critical care was necessary to treat or prevent imminent or life-threatening deterioration of the following conditions:  COVID-19 with hypoxic respiratory failure.   Critical care was time spent personally by me on the following activities:  Discussions with consultants, evaluation of patient's response to treatment, examination of patient, ordering and performing treatments and interventions, ordering and review of laboratory studies, ordering and review of radiographic studies, pulse oximetry, re-evaluation of patient's condition, obtaining history from patient or surrogate and review of old charts    ED Course and Medical Decision Making  I have  reviewed the triage vital signs, the nursing notes, and pertinent available records from the EMR.  Pertinent labs & imaging results that were available during my care of the patient were reviewed by me and considered in my medical decision making (see below for details).     Tested positive for COVID-19 8 days ago, here with mild symptoms, denies shortness of breath but has increased work of breathing on exam, hypoxic to 78% on room air, requiring 4 L nasal cannula.  No evidence of DVT, no chest pain, doubt PE.  All can be explained by COVID-19 pneumonia.  Awaiting labs, x-ray, will admit to hospitalist service.  Providing Decadron.    Elmer Sow. Pilar Plate, MD [facility] Emergency Medicine [facility] [email or web address]  Final Clinical Impressions(s) / ED Diagnoses     ICD-10-CM   1. COVID-19  U07.1   2. Acute respiratory failure with hypoxia (HCC)  J96.01     ED Discharge Orders    None       Discharge Instructions Discussed with and Provided to Patient:   Discharge Instructions   None       Sabas Sous, MD 01/24/20 1102

## 2020-01-24 NOTE — Plan of Care (Signed)
Begin course of remedesivir (Bolus to be given today).   Start IV antibitocs. Normal Saline.  Tele monitoring.

## 2020-01-25 DIAGNOSIS — E785 Hyperlipidemia, unspecified: Secondary | ICD-10-CM

## 2020-01-25 LAB — CBC WITH DIFFERENTIAL/PLATELET
Abs Immature Granulocytes: 0.08 10*3/uL — ABNORMAL HIGH (ref 0.00–0.07)
Basophils Absolute: 0 10*3/uL (ref 0.0–0.1)
Basophils Relative: 0 %
Eosinophils Absolute: 0 10*3/uL (ref 0.0–0.5)
Eosinophils Relative: 0 %
HCT: 35 % — ABNORMAL LOW (ref 39.0–52.0)
Hemoglobin: 11.9 g/dL — ABNORMAL LOW (ref 13.0–17.0)
Immature Granulocytes: 1 %
Lymphocytes Relative: 7 %
Lymphs Abs: 0.8 10*3/uL (ref 0.7–4.0)
MCH: 30.4 pg (ref 26.0–34.0)
MCHC: 34 g/dL (ref 30.0–36.0)
MCV: 89.5 fL (ref 80.0–100.0)
Monocytes Absolute: 0.6 10*3/uL (ref 0.1–1.0)
Monocytes Relative: 5 %
Neutro Abs: 9.8 10*3/uL — ABNORMAL HIGH (ref 1.7–7.7)
Neutrophils Relative %: 87 %
Platelets: 339 10*3/uL (ref 150–400)
RBC: 3.91 MIL/uL — ABNORMAL LOW (ref 4.22–5.81)
RDW: 12.5 % (ref 11.5–15.5)
WBC: 11.2 10*3/uL — ABNORMAL HIGH (ref 4.0–10.5)
nRBC: 0 % (ref 0.0–0.2)

## 2020-01-25 LAB — COMPREHENSIVE METABOLIC PANEL
ALT: 14 U/L (ref 0–44)
AST: 22 U/L (ref 15–41)
Albumin: 2.8 g/dL — ABNORMAL LOW (ref 3.5–5.0)
Alkaline Phosphatase: 74 U/L (ref 38–126)
Anion gap: 9 (ref 5–15)
BUN: 29 mg/dL — ABNORMAL HIGH (ref 8–23)
CO2: 25 mmol/L (ref 22–32)
Calcium: 7.9 mg/dL — ABNORMAL LOW (ref 8.9–10.3)
Chloride: 103 mmol/L (ref 98–111)
Creatinine, Ser: 0.89 mg/dL (ref 0.61–1.24)
GFR calc Af Amer: >60 mL/min (ref >60)
GFR calc non Af Amer: >60 mL/min (ref >60)
Glucose, Bld: 240 mg/dL — ABNORMAL HIGH (ref 70–99)
Potassium: 4.3 mmol/L (ref 3.5–5.1)
Sodium: 137 mmol/L (ref 135–145)
Total Bilirubin: 0.7 mg/dL (ref 0.3–1.2)
Total Protein: 6.7 g/dL (ref 6.5–8.1)

## 2020-01-25 LAB — GLUCOSE, CAPILLARY
Glucose-Capillary: 189 mg/dL — ABNORMAL HIGH (ref 70–99)
Glucose-Capillary: 277 mg/dL — ABNORMAL HIGH (ref 70–99)
Glucose-Capillary: 202 mg/dL — ABNORMAL HIGH (ref 70–99)
Glucose-Capillary: 146 mg/dL — ABNORMAL HIGH (ref 70–99)
Glucose-Capillary: 120 mg/dL — ABNORMAL HIGH (ref 70–99)

## 2020-01-25 LAB — MAGNESIUM: Magnesium: 2.7 mg/dL — ABNORMAL HIGH (ref 1.7–2.4)

## 2020-01-25 LAB — C-REACTIVE PROTEIN: CRP: 11.4 mg/dL — ABNORMAL HIGH (ref <1.0)

## 2020-01-25 LAB — D-DIMER, QUANTITATIVE: D-Dimer, Quant: 2.48 ug/mL-FEU — ABNORMAL HIGH (ref 0.00–0.50)

## 2020-01-25 MED ORDER — METHYLPREDNISOLONE SODIUM SUCC 40 MG IJ SOLR
35.0000 mg | Freq: Two times a day (BID) | INTRAMUSCULAR | Status: DC
  Administered 2020-01-25 – 2020-01-30 (×11): 35 mg via INTRAVENOUS
  Filled 2020-01-25 (×11): qty 1

## 2020-01-25 MED ORDER — ORAL CARE MOUTH RINSE
15.0000 mL | Freq: Two times a day (BID) | OROMUCOSAL | Status: DC
  Administered 2020-01-25 – 2020-01-29 (×10): 15 mL via OROMUCOSAL

## 2020-01-25 MED ORDER — INSULIN DETEMIR 100 UNIT/ML ~~LOC~~ SOLN
8.0000 [IU] | Freq: Two times a day (BID) | SUBCUTANEOUS | Status: DC
  Administered 2020-01-25 – 2020-01-27 (×5): 8 [IU] via SUBCUTANEOUS
  Filled 2020-01-25 (×6): qty 0.08

## 2020-01-25 MED ORDER — LIVING WELL WITH DIABETES BOOK - IN SPANISH
Freq: Once | Status: DC
  Filled 2020-01-25 (×2): qty 1

## 2020-01-25 MED ORDER — TOCILIZUMAB 400 MG/20ML IV SOLN
510.0000 mg | Freq: Once | INTRAVENOUS | Status: AC
  Administered 2020-01-25: 510 mg via INTRAVENOUS
  Filled 2020-01-25: qty 10

## 2020-01-25 NOTE — Progress Notes (Signed)
Patients daughter, Kimmy Parish, was updated on patients status, POC, and discharge plan. All questions answered at this time.

## 2020-01-25 NOTE — Progress Notes (Signed)
Inpatient Diabetes Program Recommendations  AACE/ADA: New Consensus Statement on Inpatient Glycemic Control (2015)  Target Ranges:  Prepandial:   less than 140 mg/dL      Peak postprandial:   less than 180 mg/dL (1-2 hours)      Critically ill patients:  140 - 180 mg/dL   Lab Results  Component Value Date   GLUCAP 277 (H) 01/25/2020   HGBA1C 12.2 (H) 01/24/2020    Review of Glycemic Control  Diabetes history: DM2 Outpatient Diabetes medications: 70/30 20-16-20 units tid, metformin 1000 mg QD Current orders for Inpatient glycemic control: Levemir 8 units bid, Novolog 0-20 units tidwc and hs  HgbA1C - 12.2% On Solumedrol 35 units Q12H Post-prandials elevated. May benefit from addition of meal coverage insulin.  Inpatient Diabetes Program Recommendations:     Add Novolog 6 units tidwc for meal coverage insulin if pt eats > 50% meal.  Will send [place] Living Well with Diabetes book. Will speak with pt about his glucose control and HgbA1C of 12.2% when appropriate.   Continue to follow closely.  Thank you. Ailene Ards, RD, LDN, CDE Inpatient Diabetes Coordinator [phone]

## 2020-01-25 NOTE — Plan of Care (Signed)
Patient Care Plan  Problem: Education: Goal: Knowledge of risk factors and measures for prevention of condition will improve Outcome: Progressing   Problem: Coping: Goal: Psychosocial and spiritual needs will be supported Outcome: Progressing   Problem: Respiratory: Goal: Will maintain a patent airway Outcome: Progressing Goal: Complications related to the disease process, condition or treatment will be avoided or minimized Outcome: Progressing   

## 2020-01-25 NOTE — Progress Notes (Signed)
PROGRESS NOTE  William [name] HBZ:[id number] DOB: 07/09/1951 DOA: 01/24/2020 PCP: Patient, No Pcp Per   LOS: 1 day   Brief Narrative / Interim history: 69 yo Spanish speaking male with history of DM2, HLD who came to [facility] with cough, chills, dyspnea, found to be hypoxic with sats in the 70s on room air requiring 4 L nasal cannula.  He tested positive for COVID-19, chest x-ray showed multifocal pneumonia and he was admitted to the hospital.  Subjective / 24h Interval events: States that he is feeling well and overall better.  Appreciates his breathing is improved, however is breathing 30 times a minute while talking to me.  No chest pain, no abdominal pain, no nausea or vomiting  Assessment & Plan:  Principal Problem Acute Hypoxic Respiratory Failure due to Covid-19 Viral Illness -Patient admitted to the hospital with hypoxic respiratory failure satting in the mid 70s on room air.  He was admitted on 4 L, this morning his oxygen requirements increased slightly but his sats are in the 80s and he is tachypneic, clinically appearing slightly worse than last night. -He is on steroids, continue -Continue remdesivir -He did have leukocytosis and slightly elevated procalcitonin on arrival and was started on ceftriaxone and azithromycin, continue for 5 days -Given slightly worsening clinical scenario since last night and potential for decline patient was consented to give Actemra on 2/18 -Closely monitor inflammatory markers  COVID-19 Labs  Recent Labs    01/24/20 0938 01/24/20 0939 01/25/20 0250  DDIMER 3.85*  --  2.48*  FERRITIN  --  762*  --   LDH 477*  --   --   CRP  --  14.5* 11.4*    Lab Results  Component Value Date   SARSCOV2NAA Detected (A) 01/16/2020    This patient has confirmed COVID-19 in the setting of the ongoing 2020 coronavirus pandemic.  The patient has hypoxia and is high-risk for intubation, but expected to survive >48 hours and has good baseline  functional status.  He is not known to be on immunomodulators, anti-rejection medications, or cancer chemotherapy, has no history of TB or latent TB, and no diverticulitis or intestinal perforation.  Platelets are >50K, ANC is >500, and ALT/AST are below 5x ULN with no known hepatitis B infection. The investigational nature of this medication was discussed with the patient and he agrees with the use of this medication. Complete risks and long-term side effects are unknown, however in the best clinical judgment they seem to be of some clinical benefit rather than medical risks.  Patient agrees with the treatment plan and administration of Actemra  Active Problems Type 2 diabetes mellitus, uncontrolled, with steroid-induced hyperglycemia -A1c 12, patient was started on sliding scale but CBGs are high, add Levemir today  CBG (last 3)  Recent Labs    01/24/20 1232 01/24/20 2000 01/25/20 0805  GLUCAP 231* 394* 189*     Scheduled Meds: . albuterol  2 puff Inhalation Q6H  . vitamin C  500 mg Oral Daily  . dexamethasone (DECADRON) injection  6 mg Intravenous Q24H  . enoxaparin (LOVENOX) injection  40 mg Subcutaneous Q24H  . folic acid  1 mg Oral Daily  . guaiFENesin  600 mg Oral BID  . insulin aspart  0-20 Units Subcutaneous TID WC  . insulin aspart  0-5 Units Subcutaneous QHS  . multivitamin with minerals  1 tablet Oral Daily  . thiamine  100 mg Oral Daily  . zinc sulfate  220 mg Oral Daily  Continuous Infusions: . azithromycin 500 mg (01/24/20 1530)  . cefTRIAXone (ROCEPHIN)  IV 1 g (01/24/20 1310)  . remdesivir 100 mg in NS 100 mL    . tocilizumab (ACTEMRA) - non-COVID treatment     PRN Meds:.chlorpheniramine-HYDROcodone, guaiFENesin-dextromethorphan  DVT prophylaxis: Lovenox Code Status: Full code Family Communication: Discussed with patient, he declined me to call his family and said that he updated his daughter Patient admitted from: Home Anticipated d/c place: Home Barriers  to d/c: Hypoxic respiratory failure, IV therapies  Consultants:  None  Procedures:  None   Microbiology: None   Antibacterials: Ceftriaxone / Azithromycin 2/17 (#2/5)>> plan to end 2/21 after 5 days   Objective: Vitals:   01/24/20 1300 01/24/20 1955 01/25/20 0432 01/25/20 0800  BP: 106/62 (!) 101/59 136/89 132/74  Pulse: 78 74 87 82  Resp: (!) 28 (!) 29 18 (!) 28  Temp:  99.4 F (37.4 C) 98.9 F (37.2 C) 97.8 F (36.6 C)  TempSrc:  Oral Oral Axillary  SpO2: 97% 96% 97% 94%  Weight:      Height:        Intake/Output Summary (Last 24 hours) at 01/25/2020 0919 Last data filed at 01/25/2020 0500 Gross per 24 hour  Intake 875 ml  Output 1400 ml  Net -525 ml   Filed Weights   01/24/20 0831  Weight: 63.5 kg    Examination:  Constitutional: NAD, tachypneic Eyes: no scleral icterus ENMT: Mucous membranes are moist.  Neck: normal, supple Respiratory: Increased respiratory effort, tachypneic, bibasilar rhonchi, no wheezing, no crackles Cardiovascular: Regular rate and rhythm, no murmurs / rubs / gallops. No LE edema.  Abdomen: non distended, no tenderness. Bowel sounds positive.  Musculoskeletal: no clubbing / cyanosis.  Skin: no rashes Neurologic: CN 2-12 grossly intact. Strength 5/5 in all 4.  Psychiatric: Normal judgment and insight. Alert and oriented x 3. Normal mood.    Data Reviewed: I have independently reviewed following labs and imaging studies   CBC: Recent Labs  Lab 01/24/20 0938 01/25/20 0250  WBC 14.0* 11.2*  NEUTROABS 12.3* 9.8*  HGB 13.6 11.9*  HCT 41.1 35.0*  MCV 89.5 89.5  PLT 340 339   Basic Metabolic Panel: Recent Labs  Lab 01/24/20 0938 01/25/20 0250  NA 134* 137  K 3.9 4.3  CL 92* 103  CO2 28 25  GLUCOSE 176* 240*  BUN 39* 29*  CREATININE 1.24 0.89  CALCIUM 8.5* 7.9*  MG  --  2.7*   GFR: Estimated Creatinine Clearance: 63.1 mL/min (by C-G formula based on SCr of 0.89 mg/dL). Liver Function Tests: Recent Labs  Lab  01/24/20 0938 01/25/20 0250  AST 27 22  ALT 17 14  ALKPHOS 82 74  BILITOT 1.0 0.7  PROT 8.0 6.7  ALBUMIN 3.5 2.8*   No results for input(s): LIPASE, AMYLASE in the last 168 hours. No results for input(s): AMMONIA in the last 168 hours. Coagulation Profile: No results for input(s): INR, PROTIME in the last 168 hours. Cardiac Enzymes: No results for input(s): CKTOTAL, CKMB, CKMBINDEX, TROPONINI in the last 168 hours. BNP (last 3 results) No results for input(s): PROBNP in the last 8760 hours. HbA1C: Recent Labs    01/24/20 1227  HGBA1C 12.2*   CBG: Recent Labs  Lab 01/24/20 1232 01/24/20 2000 01/25/20 0805  GLUCAP 231* 394* 189*   Lipid Profile: Recent Labs    01/24/20 0939  TRIG 193*   Thyroid Function Tests: No results for input(s): TSH, T4TOTAL, FREET4, T3FREE, THYROIDAB in the last  72 hours. Anemia Panel: Recent Labs    01/24/20 0939  FERRITIN 762*   Urine analysis:    Component Value Date/Time   COLORURINE YELLOW 03/22/2014 1130   APPEARANCEUR CLEAR 03/22/2014 1130   LABSPEC 1.010 03/22/2014 1130   PHURINE 5.5 03/22/2014 1130   GLUCOSEU >1000 (A) 03/22/2014 1130   HGBUR NEGATIVE 03/22/2014 1130   BILIRUBINUR NEGATIVE 03/22/2014 1130   KETONESUR NEGATIVE 03/22/2014 1130   PROTEINUR NEGATIVE 03/22/2014 1130   UROBILINOGEN 0.2 03/22/2014 1130   NITRITE NEGATIVE 03/22/2014 1130   LEUKOCYTESUR NEGATIVE 03/22/2014 1130   Sepsis Labs: Invalid input(s): PROCALCITONIN, LACTICIDVEN  Recent Results (from the past 240 hour(s))  Novel Coronavirus, NAA ([facility])     Status: Abnormal   Collection Time: 01/16/20  9:15 AM   Specimen: Nasopharyngeal(NP) swabs in vial transport medium   NASOPHARYNGE  TESTING  Result Value Ref Range Status   SARS-CoV-2, NAA Detected (A) Not Detected Final    Comment: This nucleic acid amplification test was developed and its performance characteristics determined by [employer]. Nucleic acid amplification tests  include RT-PCR and TMA. This test has not been FDA cleared or approved. This test has been authorized by FDA under an Emergency Use Authorization (EUA). This test is only authorized for the duration of time the declaration that circumstances exist justifying the authorization of the emergency use of in vitro diagnostic tests for detection of SARS-CoV-2 virus and/or diagnosis of COVID-19 infection under section 564(b)(1) of the Act, 21 U.S.C. 630ZSW-1(U) (1), unless the authorization is terminated or revoked sooner. When diagnostic testing is negative, the possibility of a false negative result should be considered in the context of a patient's recent exposures and the presence of clinical signs and symptoms consistent with COVID-19. An individual without symptoms of COVID-19 and who is not shedding SARS-CoV-2 virus wo uld expect to have a negative (not detected) result in this assay.   Blood Culture (routine x 2)     Status: None (Preliminary result)   Collection Time: 01/24/20  9:37 AM   Specimen: BLOOD RIGHT ARM  Result Value Ref Range Status   Specimen Description BLOOD RIGHT ARM  Final   Special Requests   Final    BOTTLES DRAWN AEROBIC AND ANAEROBIC Blood Culture adequate volume   Culture   Final    NO GROWTH < 24 HOURS Performed at [facility], [address] [place], [place] [address]    Report Status PENDING  Incomplete  Blood Culture (routine x 2)     Status: None (Preliminary result)   Collection Time: 01/24/20  9:38 AM   Specimen: BLOOD LEFT ARM  Result Value Ref Range Status   Specimen Description BLOOD LEFT ARM  Final   Special Requests   Final    BOTTLES DRAWN AEROBIC AND ANAEROBIC Blood Culture results may not be optimal due to an excessive volume of blood received in culture bottles   Culture   Final    NO GROWTH < 24 HOURS Performed at [facility], [address] [place], [place] [address]    Report Status PENDING  Incomplete      Radiology Studies: DG  Chest Port 1 View  Result Date: 01/24/2020 CLINICAL DATA:  History of COVID-19 positivity with persistent weakness and fatigue EXAM: PORTABLE CHEST 1 VIEW COMPARISON:  None. FINDINGS: Cardiac shadow is within normal limits. The lungs are well aerated bilaterally. Patchy opacities are identified bilaterally consistent with the given clinical history. No bony abnormality is seen. IMPRESSION: Patchy opacities  consistent with the clinical history of COVID-19 positivity. Electronically Signed   By: Alcide Clever M.D.   On: 01/24/2020 09:36    Pamella Pert, MD, PhD [facility]  Between 7 am - 7 pm I am available, please contact me via Amion or Securechat  Between 7 pm - 7 am I am not available, please contact night coverage MD/APP via Amion

## 2020-01-25 NOTE — Progress Notes (Signed)
Patients mew score is YELLOW. This is not an acute change. RR has been elevated since admission.

## 2020-01-26 LAB — CBC WITH DIFFERENTIAL/PLATELET
Abs Immature Granulocytes: 0.04 10*3/uL (ref 0.00–0.07)
Basophils Absolute: 0 10*3/uL (ref 0.0–0.1)
Basophils Relative: 0 %
Eosinophils Absolute: 0 10*3/uL (ref 0.0–0.5)
Eosinophils Relative: 0 %
HCT: 34.7 % — ABNORMAL LOW (ref 39.0–52.0)
Hemoglobin: 11.7 g/dL — ABNORMAL LOW (ref 13.0–17.0)
Immature Granulocytes: 1 %
Lymphocytes Relative: 7 %
Lymphs Abs: 0.4 10*3/uL — ABNORMAL LOW (ref 0.7–4.0)
MCH: 30.6 pg (ref 26.0–34.0)
MCHC: 33.7 g/dL (ref 30.0–36.0)
MCV: 90.8 fL (ref 80.0–100.0)
Monocytes Absolute: 0.1 10*3/uL (ref 0.1–1.0)
Monocytes Relative: 2 %
Neutro Abs: 5.5 10*3/uL (ref 1.7–7.7)
Neutrophils Relative %: 90 %
Platelets: 388 10*3/uL (ref 150–400)
RBC: 3.82 MIL/uL — ABNORMAL LOW (ref 4.22–5.81)
RDW: 12.5 % (ref 11.5–15.5)
WBC: 6.1 10*3/uL (ref 4.0–10.5)
nRBC: 0 % (ref 0.0–0.2)

## 2020-01-26 LAB — COMPREHENSIVE METABOLIC PANEL
ALT: 16 U/L (ref 0–44)
AST: 27 U/L (ref 15–41)
Albumin: 2.6 g/dL — ABNORMAL LOW (ref 3.5–5.0)
Alkaline Phosphatase: 74 U/L (ref 38–126)
Anion gap: 8 (ref 5–15)
BUN: 27 mg/dL — ABNORMAL HIGH (ref 8–23)
CO2: 26 mmol/L (ref 22–32)
Calcium: 7.8 mg/dL — ABNORMAL LOW (ref 8.9–10.3)
Chloride: 102 mmol/L (ref 98–111)
Creatinine, Ser: 0.72 mg/dL (ref 0.61–1.24)
GFR calc Af Amer: >60 mL/min (ref >60)
GFR calc non Af Amer: >60 mL/min (ref >60)
Glucose, Bld: 161 mg/dL — ABNORMAL HIGH (ref 70–99)
Potassium: 4.4 mmol/L (ref 3.5–5.1)
Sodium: 136 mmol/L (ref 135–145)
Total Bilirubin: 0.5 mg/dL (ref 0.3–1.2)
Total Protein: 6.1 g/dL — ABNORMAL LOW (ref 6.5–8.1)

## 2020-01-26 LAB — C-REACTIVE PROTEIN: CRP: 7.6 mg/dL — ABNORMAL HIGH (ref <1.0)

## 2020-01-26 LAB — D-DIMER, QUANTITATIVE: D-Dimer, Quant: 1.36 ug/mL-FEU — ABNORMAL HIGH (ref 0.00–0.50)

## 2020-01-26 LAB — GLUCOSE, CAPILLARY
Glucose-Capillary: 182 mg/dL — ABNORMAL HIGH (ref 70–99)
Glucose-Capillary: 308 mg/dL — ABNORMAL HIGH (ref 70–99)
Glucose-Capillary: 399 mg/dL — ABNORMAL HIGH (ref 70–99)
Glucose-Capillary: 273 mg/dL — ABNORMAL HIGH (ref 70–99)

## 2020-01-26 LAB — MAGNESIUM: Magnesium: 2.4 mg/dL (ref 1.7–2.4)

## 2020-01-26 MED ORDER — POLYETHYLENE GLYCOL 3350 17 G PO PACK: 17.0000 g | PACK | Freq: Every day | ORAL | Status: DC | PRN

## 2020-01-26 MED ORDER — AZITHROMYCIN 250 MG PO TABS
500.0000 mg | ORAL_TABLET | Freq: Every day | ORAL | Status: DC
  Administered 2020-01-27 – 2020-01-29 (×3): 500 mg via ORAL
  Filled 2020-01-26 (×3): qty 2

## 2020-01-26 MED ORDER — SODIUM CHLORIDE 0.9% FLUSH
3.0000 mL | Freq: Two times a day (BID) | INTRAVENOUS | Status: DC
  Administered 2020-01-26 – 2020-01-30 (×8): 3 mL via INTRAVENOUS

## 2020-01-26 MED ORDER — SODIUM CHLORIDE 0.9% FLUSH: 3.0000 mL | INTRAVENOUS | Status: DC | PRN

## 2020-01-26 NOTE — Progress Notes (Signed)
PROGRESS NOTE  William [name] ZOX:[id number] DOB: [birth year] DOA: 01/24/2020 PCP: Patient, No Pcp Per   LOS: 2 days   Brief Narrative / Interim history: 69 yo Spanish speaking male with history of DM2, HLD who came to [facility] with cough, chills, dyspnea, found to be hypoxic with sats in the 70s on room air requiring 4 L nasal cannula.  He tested positive for COVID-19, chest x-ray showed multifocal pneumonia and he was admitted to the hospital.  Subjective / 24h Interval events: Denies any significant shortness of breath at rest, no chest pain, no abdominal pain, no nausea or vomiting.  Assessment & Plan:  Principal Problem Acute Hypoxic Respiratory Failure due to Covid-19 Viral Illness -Patient admitted to the hospital with hypoxic respiratory failure satting in the mid 70s on room air.  He was admitted on 4 L, oxygen requirements increased to 8 L on 2/18, however seems to be back on 4 L today -He is on steroids, continue -Continue remdesivir -He did have leukocytosis and slightly elevated procalcitonin on arrival and was started on ceftriaxone and azithromycin, continue for 5 days -Status post Actemra 2/18  COVID-19 Labs  Recent Labs    01/24/20 0938 01/24/20 0939 01/25/20 0250 01/26/20 0105  DDIMER 3.85*  --  2.48* 1.36*  FERRITIN  --  762*  --   --   LDH 477*  --   --   --   CRP  --  14.5* 11.4* 7.6*    Lab Results  Component Value Date   SARSCOV2NAA Detected (A) 01/16/2020   Active Problems Type 2 diabetes mellitus, uncontrolled, with steroid-induced hyperglycemia -A1c 12, patient was started on sliding scale but CBGs are high, add Levemir today  CBG (last 3)  Recent Labs    01/25/20 2006 01/25/20 2158 01/26/20 0728  GLUCAP 146* 120* 182*    Scheduled Meds: . albuterol  2 puff Inhalation Q6H  . vitamin C  500 mg Oral Daily  . enoxaparin (LOVENOX) injection  40 mg Subcutaneous Q24H  . folic acid  1 mg Oral Daily  . guaiFENesin  600 mg Oral BID  .  insulin aspart  0-20 Units Subcutaneous TID WC  . insulin aspart  0-5 Units Subcutaneous QHS  . insulin detemir  8 Units Subcutaneous BID  . living well with diabetes book- in spanish   Does not apply Once  . mouth rinse  15 mL Mouth Rinse BID  . methylPREDNISolone (SOLU-MEDROL) injection  35 mg Intravenous Q12H  . multivitamin with minerals  1 tablet Oral Daily  . sodium chloride flush  3 mL Intravenous Q12H  . thiamine  100 mg Oral Daily  . zinc sulfate  220 mg Oral Daily   Continuous Infusions: . azithromycin 500 mg (01/25/20 1354)  . cefTRIAXone (ROCEPHIN)  IV 1 g (01/25/20 1227)  . remdesivir 100 mg in NS 100 mL Stopped (01/25/20 1148)   PRN Meds:.chlorpheniramine-HYDROcodone, guaiFENesin-dextromethorphan  DVT prophylaxis: Lovenox Code Status: Full code Family Communication: Discussed with patient, all questions answered Patient admitted from: Home Anticipated d/c place: Home Barriers to d/c: Hypoxic respiratory failure, IV therapies  Consultants:  None  Procedures:  None   Microbiology: None   Antibacterials: Ceftriaxone / Azithromycin 2/17 (#2/5)>> plan to end 2/21 after 5 days   Objective: Vitals:   01/25/20 1536 01/25/20 1943 01/26/20 0417 01/26/20 0730  BP:  133/88 (!) 103/53 108/68  Pulse:  90 77   Resp:  (!) 25 16   Temp: 99.3 F (37.4 C) 98.5  F (36.9 C) 98.3 F (36.8 C) 97.7 F (36.5 C)  TempSrc: Oral Oral Oral Oral  SpO2:  96% 92%   Weight:      Height:        Intake/Output Summary (Last 24 hours) at 01/26/2020 1026 Last data filed at 01/25/2020 8938 Gross per 24 hour  Intake 450 ml  Output 925 ml  Net -475 ml   Filed Weights   01/24/20 0831  Weight: 63.5 kg    Examination:  Constitutional: No distress, looks more comfortable Eyes: No icterus seen ENMT: Moist mucous membranes Neck: normal, supple Respiratory: Tachypneic, diminished at the bases/faint rhonchi, no wheezing or crackles Cardiovascular: Regular rate and rhythm, no  murmurs, no edema Abdomen: Soft, nontender, nondistended, bowel sounds positive Musculoskeletal: no clubbing / cyanosis.  Skin: No rashes appreciated Neurologic: No focal deficits, equal strength Psychiatric: Normal judgment and insight. Alert and oriented x 3. Normal mood.    Data Reviewed: I have independently reviewed following labs and imaging studies   CBC: Recent Labs  Lab 01/24/20 0938 01/25/20 0250 01/26/20 0105  WBC 14.0* 11.2* 6.1  NEUTROABS 12.3* 9.8* 5.5  HGB 13.6 11.9* 11.7*  HCT 41.1 35.0* 34.7*  MCV 89.5 89.5 90.8  PLT 340 339 388   Basic Metabolic Panel: Recent Labs  Lab 01/24/20 0938 01/25/20 0250 01/26/20 0105  NA 134* 137 136  K 3.9 4.3 4.4  CL 92* 103 102  CO2 28 25 26   GLUCOSE 176* 240* 161*  BUN 39* 29* 27*  CREATININE 1.24 0.89 0.72  CALCIUM 8.5* 7.9* 7.8*  MG  --  2.7* 2.4   GFR: Estimated Creatinine Clearance: 70.1 mL/min (by C-G formula based on SCr of 0.72 mg/dL). Liver Function Tests: Recent Labs  Lab 01/24/20 0938 01/25/20 0250 01/26/20 0105  AST 27 22 27   ALT 17 14 16   ALKPHOS 82 74 74  BILITOT 1.0 0.7 0.5  PROT 8.0 6.7 6.1*  ALBUMIN 3.5 2.8* 2.6*   No results for input(s): LIPASE, AMYLASE in the last 168 hours. No results for input(s): AMMONIA in the last 168 hours. Coagulation Profile: No results for input(s): INR, PROTIME in the last 168 hours. Cardiac Enzymes: No results for input(s): CKTOTAL, CKMB, CKMBINDEX, TROPONINI in the last 168 hours. BNP (last 3 results) No results for input(s): PROBNP in the last 8760 hours. HbA1C: Recent Labs    01/24/20 1227  HGBA1C 12.2*   CBG: Recent Labs  Lab 01/25/20 1207 01/25/20 1629 01/25/20 2006 01/25/20 2158 01/26/20 0728  GLUCAP 277* 202* 146* 120* 182*   Lipid Profile: Recent Labs    01/24/20 0939  TRIG 193*   Thyroid Function Tests: No results for input(s): TSH, T4TOTAL, FREET4, T3FREE, THYROIDAB in the last 72 hours. Anemia Panel: Recent Labs     01/24/20 0939  FERRITIN 762*   Urine analysis:    Component Value Date/Time   COLORURINE YELLOW 03/22/2014 1130   APPEARANCEUR CLEAR 03/22/2014 1130   LABSPEC 1.010 03/22/2014 1130   PHURINE 5.5 03/22/2014 1130   GLUCOSEU >1000 (A) 03/22/2014 1130   HGBUR NEGATIVE 03/22/2014 1130   BILIRUBINUR NEGATIVE 03/22/2014 1130   KETONESUR NEGATIVE 03/22/2014 1130   PROTEINUR NEGATIVE 03/22/2014 1130   UROBILINOGEN 0.2 03/22/2014 1130   NITRITE NEGATIVE 03/22/2014 1130   LEUKOCYTESUR NEGATIVE 03/22/2014 1130   Sepsis Labs: Invalid input(s): PROCALCITONIN, LACTICIDVEN  Recent Results (from the past 240 hour(s))  Blood Culture (routine x 2)     Status: None (Preliminary result)   Collection Time:  01/24/20  9:37 AM   Specimen: BLOOD RIGHT ARM  Result Value Ref Range Status   Specimen Description BLOOD RIGHT ARM  Final   Special Requests   Final    BOTTLES DRAWN AEROBIC AND ANAEROBIC Blood Culture adequate volume   Culture   Final    NO GROWTH 2 DAYS Performed at [facility], [address] [place], [place] [address]    Report Status PENDING  Incomplete  Blood Culture (routine x 2)     Status: None (Preliminary result)   Collection Time: 01/24/20  9:38 AM   Specimen: BLOOD LEFT ARM  Result Value Ref Range Status   Specimen Description BLOOD LEFT ARM  Final   Special Requests   Final    BOTTLES DRAWN AEROBIC AND ANAEROBIC Blood Culture results may not be optimal due to an excessive volume of blood received in culture bottles   Culture   Final    NO GROWTH 2 DAYS Performed at [facility], [address] [place], [place] [address]    Report Status PENDING  Incomplete      Radiology Studies: No results found.  Pamella Pert, MD, PhD [facility]  Between 7 am - 7 pm I am available, please contact me via Amion or Securechat  Between 7 pm - 7 am I am not available, please contact night coverage MD/APP via Amion

## 2020-01-26 NOTE — Progress Notes (Signed)
Spoke with daughter, Chrishon Martino, and gave update on patients status, POC, and possible discharge plan. Also updated contact list to reflect daughter, Placido Hangartner, as primary contact, per family request.

## 2020-01-26 NOTE — Progress Notes (Signed)
Spoke with patients daughter, Dolores Mcgovern, and gave update on patients status, POC, and possible discharge plan. All questions answered at that time. Patient also spoke with her.

## 2020-01-26 NOTE — Plan of Care (Signed)
PATIENT CARE PLAN  Problem: Education: Goal: Knowledge of risk factors and measures for prevention of condition will improve Outcome: Progressing   Problem: Coping: Goal: Psychosocial and spiritual needs will be supported Outcome: Progressing   Problem: Respiratory: Goal: Will maintain a patent airway Outcome: Progressing Goal: Complications related to the disease process, condition or treatment will be avoided or minimized Outcome: Progressing   

## 2020-01-26 NOTE — Progress Notes (Signed)
PHARMACIST - PHYSICIAN COMMUNICATION  CONCERNING: Antibiotic IV to Oral Route Change Policy  RECOMMENDATION: This patient is receiving azithromycin by the intravenous route.  Based on criteria approved by the Pharmacy and Therapeutics Committee, the antibiotic(s) is/are being converted to the equivalent oral dose form(s).   DESCRIPTION: These criteria include:  Patient being treated for a respiratory tract infection, urinary tract infection, cellulitis or clostridium difficile associated diarrhea if on metronidazole  The patient is not neutropenic and does not exhibit a GI malabsorption state  The patient is eating (either orally or via tube) and/or has been taking other orally administered medications for a least 24 hours  The patient is improving clinically and has a Tmax < 100.5  If you have questions about this conversion, please contact the Pharmacy Department  []  ( [phone] )  [place] []  ( [phone] )  [facility] []  ( [phone]  [place] []  ( [phone] )  [facility] [x]  ( [phone] )  [facility]  

## 2020-01-27 LAB — CBC WITH DIFFERENTIAL/PLATELET
Abs Immature Granulocytes: 0.05 10*3/uL (ref 0.00–0.07)
Basophils Absolute: 0 10*3/uL (ref 0.0–0.1)
Basophils Relative: 0 %
Eosinophils Absolute: 0 10*3/uL (ref 0.0–0.5)
Eosinophils Relative: 0 %
HCT: 34.6 % — ABNORMAL LOW (ref 39.0–52.0)
Hemoglobin: 11.7 g/dL — ABNORMAL LOW (ref 13.0–17.0)
Immature Granulocytes: 1 %
Lymphocytes Relative: 5 %
Lymphs Abs: 0.4 10*3/uL — ABNORMAL LOW (ref 0.7–4.0)
MCH: 30.8 pg (ref 26.0–34.0)
MCHC: 33.8 g/dL (ref 30.0–36.0)
MCV: 91.1 fL (ref 80.0–100.0)
Monocytes Absolute: 0.3 10*3/uL (ref 0.1–1.0)
Monocytes Relative: 4 %
Neutro Abs: 6.5 10*3/uL (ref 1.7–7.7)
Neutrophils Relative %: 90 %
Platelets: 460 10*3/uL — ABNORMAL HIGH (ref 150–400)
RBC: 3.8 MIL/uL — ABNORMAL LOW (ref 4.22–5.81)
RDW: 12.6 % (ref 11.5–15.5)
WBC: 7.2 10*3/uL (ref 4.0–10.5)
nRBC: 0 % (ref 0.0–0.2)

## 2020-01-27 LAB — COMPREHENSIVE METABOLIC PANEL
ALT: 17 U/L (ref 0–44)
AST: 23 U/L (ref 15–41)
Albumin: 2.6 g/dL — ABNORMAL LOW (ref 3.5–5.0)
Alkaline Phosphatase: 85 U/L (ref 38–126)
Anion gap: 9 (ref 5–15)
BUN: 30 mg/dL — ABNORMAL HIGH (ref 8–23)
CO2: 25 mmol/L (ref 22–32)
Calcium: 8.4 mg/dL — ABNORMAL LOW (ref 8.9–10.3)
Chloride: 103 mmol/L (ref 98–111)
Creatinine, Ser: 0.61 mg/dL (ref 0.61–1.24)
GFR calc Af Amer: >60 mL/min (ref >60)
GFR calc non Af Amer: >60 mL/min (ref >60)
Glucose, Bld: 178 mg/dL — ABNORMAL HIGH (ref 70–99)
Potassium: 4.4 mmol/L (ref 3.5–5.1)
Sodium: 137 mmol/L (ref 135–145)
Total Bilirubin: 0.3 mg/dL (ref 0.3–1.2)
Total Protein: 5.8 g/dL — ABNORMAL LOW (ref 6.5–8.1)

## 2020-01-27 LAB — C-REACTIVE PROTEIN: CRP: 4.6 mg/dL — ABNORMAL HIGH (ref <1.0)

## 2020-01-27 LAB — GLUCOSE, CAPILLARY
Glucose-Capillary: 167 mg/dL — ABNORMAL HIGH (ref 70–99)
Glucose-Capillary: 453 mg/dL — ABNORMAL HIGH (ref 70–99)
Glucose-Capillary: 435 mg/dL — ABNORMAL HIGH (ref 70–99)
Glucose-Capillary: 380 mg/dL — ABNORMAL HIGH (ref 70–99)

## 2020-01-27 LAB — D-DIMER, QUANTITATIVE: D-Dimer, Quant: 0.95 ug/mL-FEU — ABNORMAL HIGH (ref 0.00–0.50)

## 2020-01-27 LAB — GLUCOSE, RANDOM: Glucose, Bld: 460 mg/dL — ABNORMAL HIGH (ref 70–99)

## 2020-01-27 LAB — MAGNESIUM: Magnesium: 2.5 mg/dL — ABNORMAL HIGH (ref 1.7–2.4)

## 2020-01-27 MED ORDER — LINAGLIPTIN 5 MG PO TABS
5.0000 mg | ORAL_TABLET | Freq: Every day | ORAL | Status: DC
  Administered 2020-01-27 – 2020-01-30 (×4): 5 mg via ORAL
  Filled 2020-01-27 (×4): qty 1

## 2020-01-27 MED ORDER — INSULIN ASPART 100 UNIT/ML ~~LOC~~ SOLN
10.0000 [IU] | Freq: Three times a day (TID) | SUBCUTANEOUS | Status: DC
  Administered 2020-01-27 – 2020-01-28 (×2): 10 [IU] via SUBCUTANEOUS

## 2020-01-27 MED ORDER — INSULIN DETEMIR 100 UNIT/ML ~~LOC~~ SOLN
12.0000 [IU] | Freq: Two times a day (BID) | SUBCUTANEOUS | Status: DC
  Administered 2020-01-27 – 2020-01-28 (×2): 12 [IU] via SUBCUTANEOUS
  Filled 2020-01-27 (×3): qty 0.12

## 2020-01-27 MED ORDER — INSULIN ASPART 100 UNIT/ML ~~LOC~~ SOLN
5.0000 [IU] | Freq: Three times a day (TID) | SUBCUTANEOUS | Status: DC
  Administered 2020-01-27: 5 [IU] via SUBCUTANEOUS

## 2020-01-27 MED ORDER — FUROSEMIDE 10 MG/ML IJ SOLN
40.0000 mg | Freq: Once | INTRAMUSCULAR | Status: AC
  Administered 2020-01-27: 40 mg via INTRAVENOUS
  Filled 2020-01-27: qty 4

## 2020-01-27 MED ORDER — INSULIN ASPART 100 UNIT/ML ~~LOC~~ SOLN
10.0000 [IU] | Freq: Once | SUBCUTANEOUS | Status: AC
  Administered 2020-01-27: 10 [IU] via SUBCUTANEOUS

## 2020-01-27 NOTE — Progress Notes (Signed)
PROGRESS NOTE  William [name] ZDG:[id number] DOB: 01/21/1951 DOA: 01/24/2020 PCP: Patient, No Pcp Per   LOS: 3 days   Brief Narrative / Interim history: 69 yo Spanish speaking male with history of DM2, HLD who came to [facility] with cough, chills, dyspnea, found to be hypoxic with sats in the 70s on room air requiring 4 L nasal cannula.  He tested positive for COVID-19, chest x-ray showed multifocal pneumonia and he was admitted to the hospital.  Subjective / 24h Interval events: Feels well, states that he is significantly better.  No chest pain, no abdominal pain, nausea or vomiting  Assessment & Plan:  Principal Problem Acute Hypoxic Respiratory Failure due to Covid-19 Viral Illness -Patient admitted to the hospital with hypoxic respiratory failure satting in the mid 70s on room air.  He was admitted on 4 L, oxygen requirements increased to 8 L on 2/18 briefly but now stable on 4 L and he appears more comfortable -He is on steroids, continue -Continue remdesivir, scheduled to be done on 2/21 -He did have leukocytosis and slightly elevated procalcitonin on arrival and was started on ceftriaxone and azithromycin, continue for 5 days -Status post Actemra 2/18  COVID-19 Labs  Recent Labs    01/25/20 0250 01/26/20 0105 01/27/20 0144  DDIMER 2.48* 1.36* 0.95*  CRP 11.4* 7.6* 4.6*    Lab Results  Component Value Date   SARSCOV2NAA Detected (A) 01/16/2020   Active Problems Type 2 diabetes mellitus, uncontrolled, with steroid-induced hyperglycemia -A1c 12, patient was started on sliding scale but CBGs are high, Levemir was added 2/19, persistently high CBGs during daytime and will add 5 units of NovoLog in addition to his sliding scale  CBG (last 3)  Recent Labs    01/26/20 1643 01/26/20 2136 01/27/20 0710  GLUCAP 399* 273* 167*    Scheduled Meds: . albuterol  2 puff Inhalation Q6H  . vitamin C  500 mg Oral Daily  . azithromycin  500 mg Oral Daily  . enoxaparin  (LOVENOX) injection  40 mg Subcutaneous Q24H  . folic acid  1 mg Oral Daily  . guaiFENesin  600 mg Oral BID  . insulin aspart  0-20 Units Subcutaneous TID WC  . insulin aspart  0-5 Units Subcutaneous QHS  . insulin aspart  5 Units Subcutaneous TID WC  . insulin detemir  8 Units Subcutaneous BID  . living well with diabetes book- in spanish   Does not apply Once  . mouth rinse  15 mL Mouth Rinse BID  . methylPREDNISolone (SOLU-MEDROL) injection  35 mg Intravenous Q12H  . multivitamin with minerals  1 tablet Oral Daily  . sodium chloride flush  3 mL Intravenous Q12H  . thiamine  100 mg Oral Daily  . zinc sulfate  220 mg Oral Daily   Continuous Infusions: . cefTRIAXone (ROCEPHIN)  IV 1 g (01/26/20 1205)  . remdesivir 100 mg in NS 100 mL 100 mg (01/27/20 0823)   PRN Meds:.chlorpheniramine-HYDROcodone, guaiFENesin-dextromethorphan, polyethylene glycol, sodium chloride flush  DVT prophylaxis: Lovenox Code Status: Full code Family Communication: Discussed with patient, all questions answered Patient admitted from: Home Anticipated d/c place: Home Barriers to d/c: Hypoxic respiratory failure, IV therapies, hopefully discharge 24-48 hours if he can be weaned off to room air  Consultants:  None  Procedures:  None   Microbiology: None   Antibacterials: Ceftriaxone / Azithromycin 2/17 (#2/5)>> plan to end 2/21 after 5 days   Objective: Vitals:   01/26/20 2050 01/27/20 0548 01/27/20 0709 01/27/20 1030  BP: 98/64 (!) 143/83 133/77   Pulse: 81 78 70   Resp: 18 18 19    Temp: 97.9 F (36.6 C) 97.7 F (36.5 C) 98 F (36.7 C)   TempSrc: Oral Oral Oral   SpO2: 95% 95% 92% 100%  Weight:      Height:        Intake/Output Summary (Last 24 hours) at 01/27/2020 1202 Last data filed at 01/27/2020 1017 Gross per 24 hour  Intake 3 ml  Output 1250 ml  Net -1247 ml   Filed Weights   01/24/20 0831  Weight: 63.5 kg    Examination:  Constitutional: NAD, comfortable, in bed Eyes:  No scleral icterus ENMT: Moist mucous membranes Neck: normal, supple Respiratory: Diminished at the bases, no wheezing, no crackles, good air movement Cardiovascular: Regular rate and rhythm, no murmurs, no edema Abdomen: Soft, nontender, nondistended, positive bowel sounds Musculoskeletal: no clubbing / cyanosis.  Skin: No rashes seen Neurologic: Nonfocal, equal strength Psychiatric: Normal judgment and insight. Alert and oriented x 3. Normal mood.    Data Reviewed: I have independently reviewed following labs and imaging studies   CBC: Recent Labs  Lab 01/24/20 0938 01/25/20 0250 01/26/20 0105 01/27/20 0144  WBC 14.0* 11.2* 6.1 7.2  NEUTROABS 12.3* 9.8* 5.5 6.5  HGB 13.6 11.9* 11.7* 11.7*  HCT 41.1 35.0* 34.7* 34.6*  MCV 89.5 89.5 90.8 91.1  PLT 340 339 388 557*   Basic Metabolic Panel: Recent Labs  Lab 01/24/20 0938 01/25/20 0250 01/26/20 0105 01/27/20 0144  NA 134* 137 136 137  K 3.9 4.3 4.4 4.4  CL 92* 103 102 103  CO2 28 25 26 25   GLUCOSE 176* 240* 161* 178*  BUN 39* 29* 27* 30*  CREATININE 1.24 0.89 0.72 0.61  CALCIUM 8.5* 7.9* 7.8* 8.4*  MG  --  2.7* 2.4 2.5*   GFR: Estimated Creatinine Clearance: 70.1 mL/min (by C-G formula based on SCr of 0.61 mg/dL). Liver Function Tests: Recent Labs  Lab 01/24/20 0938 01/25/20 0250 01/26/20 0105 01/27/20 0144  AST 27 22 27 23   ALT 17 14 16 17   ALKPHOS 82 74 74 85  BILITOT 1.0 0.7 0.5 0.3  PROT 8.0 6.7 6.1* 5.8*  ALBUMIN 3.5 2.8* 2.6* 2.6*   No results for input(s): LIPASE, AMYLASE in the last 168 hours. No results for input(s): AMMONIA in the last 168 hours. Coagulation Profile: No results for input(s): INR, PROTIME in the last 168 hours. Cardiac Enzymes: No results for input(s): CKTOTAL, CKMB, CKMBINDEX, TROPONINI in the last 168 hours. BNP (last 3 results) No results for input(s): PROBNP in the last 8760 hours. HbA1C: Recent Labs    01/24/20 1227  HGBA1C 12.2*   CBG: Recent Labs  Lab  01/26/20 0728 01/26/20 1142 01/26/20 1643 01/26/20 2136 01/27/20 0710  GLUCAP 182* 308* 399* 273* 167*   Lipid Profile: No results for input(s): CHOL, HDL, LDLCALC, TRIG, CHOLHDL, LDLDIRECT in the last 72 hours. Thyroid Function Tests: No results for input(s): TSH, T4TOTAL, FREET4, T3FREE, THYROIDAB in the last 72 hours. Anemia Panel: No results for input(s): VITAMINB12, FOLATE, FERRITIN, TIBC, IRON, RETICCTPCT in the last 72 hours. Urine analysis:    Component Value Date/Time   COLORURINE YELLOW 03/22/2014 1130   APPEARANCEUR CLEAR 03/22/2014 1130   LABSPEC 1.010 03/22/2014 1130   PHURINE 5.5 03/22/2014 1130   GLUCOSEU >1000 (A) 03/22/2014 1130   HGBUR NEGATIVE 03/22/2014 1130   BILIRUBINUR NEGATIVE 03/22/2014 1130   KETONESUR NEGATIVE 03/22/2014 1130   PROTEINUR NEGATIVE 03/22/2014  1130   UROBILINOGEN 0.2 03/22/2014 1130   NITRITE NEGATIVE 03/22/2014 1130   LEUKOCYTESUR NEGATIVE 03/22/2014 1130   Sepsis Labs: Invalid input(s): PROCALCITONIN, LACTICIDVEN  Recent Results (from the past 240 hour(s))  Blood Culture (routine x 2)     Status: None (Preliminary result)   Collection Time: 01/24/20  9:37 AM   Specimen: BLOOD RIGHT ARM  Result Value Ref Range Status   Specimen Description BLOOD RIGHT ARM  Final   Special Requests   Final    BOTTLES DRAWN AEROBIC AND ANAEROBIC Blood Culture adequate volume   Culture   Final    NO GROWTH 3 DAYS Performed at [facility], [address], [place], [place] [address]    Report Status PENDING  Incomplete  Blood Culture (routine x 2)     Status: None (Preliminary result)   Collection Time: 01/24/20  9:38 AM   Specimen: BLOOD LEFT ARM  Result Value Ref Range Status   Specimen Description BLOOD LEFT ARM  Final   Special Requests   Final    BOTTLES DRAWN AEROBIC AND ANAEROBIC Blood Culture results may not be optimal due to an excessive volume of blood received in culture bottles   Culture   Final    NO GROWTH 3 DAYS Performed at  [facility] [address], [place], [place] [address]    Report Status PENDING  Incomplete      Radiology Studies: No results found.  Pamella Pert, MD, PhD [facility]  Between 7 am - 7 pm I am available, please contact me via Amion or Securechat  Between 7 pm - 7 am I am not available, please contact night coverage MD/APP via Amion

## 2020-01-27 NOTE — Progress Notes (Signed)
Gherghe,MD was notified of pt CBG of 448. Repeat CBG was 418.  Glucose lab draw was ordered.  MD said to give 20 units novolog sliding scale and then ordered an additional 5 units novolog to be given with meals.

## 2020-01-27 NOTE — Plan of Care (Signed)
  Problem: Education: Goal: Knowledge of risk factors and measures for prevention of condition will improve Outcome: Progressing   Problem: Coping: Goal: Psychosocial and spiritual needs will be supported Outcome: Progressing   Problem: Respiratory: Goal: Will maintain a patent airway Outcome: Progressing Goal: Complications related to the disease process, condition or treatment will be avoided or minimized Outcome: Progressing   

## 2020-01-28 LAB — COMPREHENSIVE METABOLIC PANEL
ALT: 28 U/L (ref 0–44)
AST: 31 U/L (ref 15–41)
Albumin: 2.6 g/dL — ABNORMAL LOW (ref 3.5–5.0)
Alkaline Phosphatase: 96 U/L (ref 38–126)
Anion gap: 9 (ref 5–15)
BUN: 39 mg/dL — ABNORMAL HIGH (ref 8–23)
CO2: 28 mmol/L (ref 22–32)
Calcium: 8.5 mg/dL — ABNORMAL LOW (ref 8.9–10.3)
Chloride: 102 mmol/L (ref 98–111)
Creatinine, Ser: 0.92 mg/dL (ref 0.61–1.24)
GFR calc Af Amer: >60 mL/min (ref >60)
GFR calc non Af Amer: >60 mL/min (ref >60)
Glucose, Bld: 220 mg/dL — ABNORMAL HIGH (ref 70–99)
Potassium: 4.6 mmol/L (ref 3.5–5.1)
Sodium: 139 mmol/L (ref 135–145)
Total Bilirubin: 0.4 mg/dL (ref 0.3–1.2)
Total Protein: 5.7 g/dL — ABNORMAL LOW (ref 6.5–8.1)

## 2020-01-28 LAB — CBC WITH DIFFERENTIAL/PLATELET
Abs Immature Granulocytes: 0.24 10*3/uL — ABNORMAL HIGH (ref 0.00–0.07)
Basophils Absolute: 0 10*3/uL (ref 0.0–0.1)
Basophils Relative: 0 %
Eosinophils Absolute: 0 10*3/uL (ref 0.0–0.5)
Eosinophils Relative: 0 %
HCT: 34.5 % — ABNORMAL LOW (ref 39.0–52.0)
Hemoglobin: 11.9 g/dL — ABNORMAL LOW (ref 13.0–17.0)
Immature Granulocytes: 3 %
Lymphocytes Relative: 4 %
Lymphs Abs: 0.4 10*3/uL — ABNORMAL LOW (ref 0.7–4.0)
MCH: 31.6 pg (ref 26.0–34.0)
MCHC: 34.5 g/dL (ref 30.0–36.0)
MCV: 91.5 fL (ref 80.0–100.0)
Monocytes Absolute: 0.3 10*3/uL (ref 0.1–1.0)
Monocytes Relative: 3 %
Neutro Abs: 8.6 10*3/uL — ABNORMAL HIGH (ref 1.7–7.7)
Neutrophils Relative %: 90 %
Platelets: 470 10*3/uL — ABNORMAL HIGH (ref 150–400)
RBC: 3.77 MIL/uL — ABNORMAL LOW (ref 4.22–5.81)
RDW: 12.6 % (ref 11.5–15.5)
WBC: 9.5 10*3/uL (ref 4.0–10.5)
nRBC: 0 % (ref 0.0–0.2)

## 2020-01-28 LAB — MAGNESIUM: Magnesium: 2.5 mg/dL — ABNORMAL HIGH (ref 1.7–2.4)

## 2020-01-28 LAB — GLUCOSE, CAPILLARY
Glucose-Capillary: 212 mg/dL — ABNORMAL HIGH (ref 70–99)
Glucose-Capillary: 438 mg/dL — ABNORMAL HIGH (ref 70–99)
Glucose-Capillary: 441 mg/dL — ABNORMAL HIGH (ref 70–99)
Glucose-Capillary: 366 mg/dL — ABNORMAL HIGH (ref 70–99)
Glucose-Capillary: 255 mg/dL — ABNORMAL HIGH (ref 70–99)

## 2020-01-28 LAB — D-DIMER, QUANTITATIVE: D-Dimer, Quant: 0.64 ug/mL-FEU — ABNORMAL HIGH (ref 0.00–0.50)

## 2020-01-28 LAB — C-REACTIVE PROTEIN: CRP: 2.6 mg/dL — ABNORMAL HIGH (ref <1.0)

## 2020-01-28 MED ORDER — INSULIN ASPART 100 UNIT/ML ~~LOC~~ SOLN
16.0000 [IU] | Freq: Three times a day (TID) | SUBCUTANEOUS | Status: DC
  Administered 2020-01-28: 16 [IU] via SUBCUTANEOUS

## 2020-01-28 MED ORDER — INSULIN ASPART 100 UNIT/ML ~~LOC~~ SOLN
25.0000 [IU] | Freq: Three times a day (TID) | SUBCUTANEOUS | Status: DC
  Administered 2020-01-28 – 2020-01-29 (×2): 25 [IU] via SUBCUTANEOUS

## 2020-01-28 MED ORDER — FUROSEMIDE 10 MG/ML IJ SOLN
40.0000 mg | Freq: Once | INTRAMUSCULAR | Status: AC
  Administered 2020-01-28: 40 mg via INTRAVENOUS
  Filled 2020-01-28: qty 4

## 2020-01-28 MED ORDER — INSULIN DETEMIR 100 UNIT/ML ~~LOC~~ SOLN
15.0000 [IU] | Freq: Two times a day (BID) | SUBCUTANEOUS | Status: DC
  Administered 2020-01-28: 15 [IU] via SUBCUTANEOUS
  Filled 2020-01-28 (×2): qty 0.15

## 2020-01-28 NOTE — Progress Notes (Signed)
Pt ambulated in hallway with RN.  While at rest pt oxygen saturation on room air is 94%.  Walking to the door on room air pt oxygen saturation dropped to 83%.  Placed pt on 2L [place] oxygen came up to 91%.  While ambulating in hallway on 2L [place] pt oxygen ranged from 78-94%.  After returning to room while at rest on 2L, it took pt 2 minutes to recover and was at 96%.

## 2020-01-28 NOTE — Progress Notes (Signed)
PROGRESS NOTE  William [name] WGN:[id number] DOB: [birth year] DOA: 01/24/2020 PCP: Patient, No Pcp Per   LOS: 4 days   Brief Narrative / Interim history: 69 yo Spanish speaking male with history of DM2, HLD who came to [facility] with cough, chills, dyspnea, found to be hypoxic with sats in the 70s on room air requiring 4 L nasal cannula.  He tested positive for COVID-19, chest x-ray showed multifocal pneumonia and he was admitted to the hospital.  Subjective / 24h Interval events: Denies any further complaints.  No shortness of breath.  No chest pain  Assessment & Plan:  Principal Problem Acute Hypoxic Respiratory Failure due to Covid-19 Viral Illness -Patient admitted to the hospital with hypoxic respiratory failure satting in the mid 70s on room air.  He was admitted on 4 L, oxygen requirements increased to 8 L on 2/18 briefly but now improved -He appears to be comfortable at rest on room air however with minimal ambulation he drops into the 70s -He is on steroids, continue -Continue remdesivir, scheduled to be done today -He did have leukocytosis and slightly elevated procalcitonin on arrival and was started on ceftriaxone and azithromycin, continue for 5 days -Status post Actemra 2/18  COVID-19 Labs  Recent Labs    01/26/20 0105 01/27/20 0144 01/28/20 0315  DDIMER 1.36* 0.95* 0.64*  CRP 7.6* 4.6* 2.6*    Lab Results  Component Value Date   SARSCOV2NAA Detected (A) 01/16/2020   Active Problems Type 2 diabetes mellitus, uncontrolled, with steroid-induced hyperglycemia -A1c 12 -Patient with persistent elevated CBGs, for unclear reasons he is receiving high sugar content nutritional supplements with his meals, have asked bedside RN to hold them -Continue to further increase his insulin regimen  CBG (last 3)  Recent Labs    01/27/20 1833 01/27/20 2024 01/28/20 0718  GLUCAP 435* 380* 212*    Scheduled Meds: . albuterol  2 puff Inhalation Q6H  . vitamin C  500  mg Oral Daily  . azithromycin  500 mg Oral Daily  . enoxaparin (LOVENOX) injection  40 mg Subcutaneous Q24H  . folic acid  1 mg Oral Daily  . guaiFENesin  600 mg Oral BID  . insulin aspart  0-20 Units Subcutaneous TID WC  . insulin aspart  0-5 Units Subcutaneous QHS  . insulin aspart  10 Units Subcutaneous TID WC  . insulin detemir  12 Units Subcutaneous BID  . linagliptin  5 mg Oral Daily  . living well with diabetes book- in spanish   Does not apply Once  . mouth rinse  15 mL Mouth Rinse BID  . methylPREDNISolone (SOLU-MEDROL) injection  35 mg Intravenous Q12H  . multivitamin with minerals  1 tablet Oral Daily  . sodium chloride flush  3 mL Intravenous Q12H  . thiamine  100 mg Oral Daily  . zinc sulfate  220 mg Oral Daily   Continuous Infusions: . cefTRIAXone (ROCEPHIN)  IV Stopped (01/27/20 1245)   PRN Meds:.chlorpheniramine-HYDROcodone, guaiFENesin-dextromethorphan, polyethylene glycol, sodium chloride flush  DVT prophylaxis: Lovenox Code Status: Full code Family Communication: Discussed with patient, updated daughter over the phone 2/20 Patient admitted from: Home Anticipated d/c place: Home Barriers to d/c: Persistent hypoxia  Consultants:  None  Procedures:  None   Microbiology: None   Antibacterials: Ceftriaxone / Azithromycin 2/17 (#2/5)>> plan to end 2/21 after 5 days   Objective: Vitals:   01/28/20 0615 01/28/20 0717 01/28/20 0900 01/28/20 0953  BP:  111/70    Pulse:  83    Resp:  17    Temp:  97.6 F (36.4 C)    TempSrc:  Oral    SpO2: 99% 100% (!) 89% 96%  Weight:      Height:        Intake/Output Summary (Last 24 hours) at 01/28/2020 1154 Last data filed at 01/28/2020 0630 Gross per 24 hour  Intake 343 ml  Output 1250 ml  Net -907 ml   Filed Weights   01/24/20 0831  Weight: 63.5 kg    Examination:  Constitutional: No distress, appears comfortable Eyes: No scleral icterus ENMT: Moist mucous membranes Neck: normal, supple  Respiratory: Diminished at the bases, no wheezing, no crackles, good air movement Cardiovascular: Regular rate and rhythm, no murmurs Abdomen: Nondistended, positive bowel sounds Musculoskeletal: no clubbing / cyanosis.  Skin: No rashes seen Neurologic: Grossly nonfocal, ambulatory Psychiatric: Normal judgment and insight. Alert and oriented x 3. Normal mood.   Data Reviewed: I have independently reviewed following labs and imaging studies   CBC: Recent Labs  Lab 01/24/20 0938 01/25/20 0250 01/26/20 0105 01/27/20 0144 01/28/20 0315  WBC 14.0* 11.2* 6.1 7.2 9.5  NEUTROABS 12.3* 9.8* 5.5 6.5 8.6*  HGB 13.6 11.9* 11.7* 11.7* 11.9*  HCT 41.1 35.0* 34.7* 34.6* 34.5*  MCV 89.5 89.5 90.8 91.1 91.5  PLT 340 339 388 460* 470*   Basic Metabolic Panel: Recent Labs  Lab 01/24/20 0938 01/24/20 0938 01/25/20 0250 01/26/20 0105 01/27/20 0144 01/27/20 1210 01/28/20 0315  NA 134*  --  137 136 137  --  139  K 3.9  --  4.3 4.4 4.4  --  4.6  CL 92*  --  103 102 103  --  102  CO2 28  --  25 26 25   --  28  GLUCOSE 176*   < > 240* 161* 178* 460* 220*  BUN 39*  --  29* 27* 30*  --  39*  CREATININE 1.24  --  0.89 0.72 0.61  --  0.92  CALCIUM 8.5*  --  7.9* 7.8* 8.4*  --  8.5*  MG  --   --  2.7* 2.4 2.5*  --  2.5*   < > = values in this interval not displayed.   GFR: Estimated Creatinine Clearance: 61 mL/min (by C-G formula based on SCr of 0.92 mg/dL). Liver Function Tests: Recent Labs  Lab 01/24/20 0938 01/25/20 0250 01/26/20 0105 01/27/20 0144 01/28/20 0315  AST 27 22 27 23 31   ALT 17 14 16 17 28   ALKPHOS 82 74 74 85 96  BILITOT 1.0 0.7 0.5 0.3 0.4  PROT 8.0 6.7 6.1* 5.8* 5.7*  ALBUMIN 3.5 2.8* 2.6* 2.6* 2.6*   No results for input(s): LIPASE, AMYLASE in the last 168 hours. No results for input(s): AMMONIA in the last 168 hours. Coagulation Profile: No results for input(s): INR, PROTIME in the last 168 hours. Cardiac Enzymes: No results for input(s): CKTOTAL, CKMB,  CKMBINDEX, TROPONINI in the last 168 hours. BNP (last 3 results) No results for input(s): PROBNP in the last 8760 hours. HbA1C: No results for input(s): HGBA1C in the last 72 hours. CBG: Recent Labs  Lab 01/27/20 0710 01/27/20 1653 01/27/20 1833 01/27/20 2024 01/28/20 0718  GLUCAP 167* 453* 435* 380* 212*   Lipid Profile: No results for input(s): CHOL, HDL, LDLCALC, TRIG, CHOLHDL, LDLDIRECT in the last 72 hours. Thyroid Function Tests: No results for input(s): TSH, T4TOTAL, FREET4, T3FREE, THYROIDAB in the last 72 hours. Anemia Panel: No results for input(s): VITAMINB12, FOLATE, FERRITIN, TIBC, IRON,  RETICCTPCT in the last 72 hours. Urine analysis:    Component Value Date/Time   COLORURINE YELLOW 03/22/2014 1130   APPEARANCEUR CLEAR 03/22/2014 1130   LABSPEC 1.010 03/22/2014 1130   PHURINE 5.5 03/22/2014 1130   GLUCOSEU >1000 (A) 03/22/2014 1130   HGBUR NEGATIVE 03/22/2014 1130   BILIRUBINUR NEGATIVE 03/22/2014 1130   KETONESUR NEGATIVE 03/22/2014 1130   PROTEINUR NEGATIVE 03/22/2014 1130   UROBILINOGEN 0.2 03/22/2014 1130   NITRITE NEGATIVE 03/22/2014 1130   LEUKOCYTESUR NEGATIVE 03/22/2014 1130   Sepsis Labs: Invalid input(s): PROCALCITONIN, LACTICIDVEN  Recent Results (from the past 240 hour(s))  Blood Culture (routine x 2)     Status: None (Preliminary result)   Collection Time: 01/24/20  9:37 AM   Specimen: BLOOD RIGHT ARM  Result Value Ref Range Status   Specimen Description BLOOD RIGHT ARM  Final   Special Requests   Final    BOTTLES DRAWN AEROBIC AND ANAEROBIC Blood Culture adequate volume   Culture   Final    NO GROWTH 3 DAYS Performed at [facility], [address] [place], [place] [address]    Report Status PENDING  Incomplete  Blood Culture (routine x 2)     Status: None (Preliminary result)   Collection Time: 01/24/20  9:38 AM   Specimen: BLOOD LEFT ARM  Result Value Ref Range Status   Specimen Description BLOOD LEFT ARM  Final   Special  Requests   Final    BOTTLES DRAWN AEROBIC AND ANAEROBIC Blood Culture results may not be optimal due to an excessive volume of blood received in culture bottles   Culture   Final    NO GROWTH 3 DAYS Performed at [facility], [address], [place], [place] [address]    Report Status PENDING  Incomplete      Radiology Studies: No results found.  Marzetta Board, MD, PhD [facility]  Between 7 am - 7 pm I am available, please contact me via Amion or Securechat  Between 7 pm - 7 am I am not available, please contact night coverage MD/APP via Amion

## 2020-01-28 NOTE — Progress Notes (Deleted)
Patient unable to be discharged last night, refer to nursing note for further details.  No overnight events, discussed with bedside RN, appears stable for discharge today  William [name] M. Elvera Lennox, MD, PhD [facility]

## 2020-01-29 ENCOUNTER — Inpatient Hospital Stay (HOSPITAL_COMMUNITY): Payer: HRSA Program

## 2020-01-29 LAB — CBC WITH DIFFERENTIAL/PLATELET
Abs Immature Granulocytes: 0.46 10*3/uL — ABNORMAL HIGH (ref 0.00–0.07)
Basophils Absolute: 0 10*3/uL (ref 0.0–0.1)
Basophils Relative: 0 %
Eosinophils Absolute: 0 10*3/uL (ref 0.0–0.5)
Eosinophils Relative: 0 %
HCT: 35 % — ABNORMAL LOW (ref 39.0–52.0)
Hemoglobin: 11.7 g/dL — ABNORMAL LOW (ref 13.0–17.0)
Immature Granulocytes: 4 %
Lymphocytes Relative: 3 %
Lymphs Abs: 0.4 10*3/uL — ABNORMAL LOW (ref 0.7–4.0)
MCH: 30.4 pg (ref 26.0–34.0)
MCHC: 33.4 g/dL (ref 30.0–36.0)
MCV: 90.9 fL (ref 80.0–100.0)
Monocytes Absolute: 0.2 10*3/uL (ref 0.1–1.0)
Monocytes Relative: 2 %
Neutro Abs: 11.4 10*3/uL — ABNORMAL HIGH (ref 1.7–7.7)
Neutrophils Relative %: 91 %
Platelets: 523 10*3/uL — ABNORMAL HIGH (ref 150–400)
RBC: 3.85 MIL/uL — ABNORMAL LOW (ref 4.22–5.81)
RDW: 12.7 % (ref 11.5–15.5)
WBC: 12.5 10*3/uL — ABNORMAL HIGH (ref 4.0–10.5)
nRBC: 0.3 % — ABNORMAL HIGH (ref 0.0–0.2)

## 2020-01-29 LAB — COMPREHENSIVE METABOLIC PANEL WITH GFR
ALT: 26 U/L (ref 0–44)
AST: 25 U/L (ref 15–41)
Albumin: 2.7 g/dL — ABNORMAL LOW (ref 3.5–5.0)
Alkaline Phosphatase: 120 U/L (ref 38–126)
Anion gap: 8 (ref 5–15)
BUN: 50 mg/dL — ABNORMAL HIGH (ref 8–23)
CO2: 30 mmol/L (ref 22–32)
Calcium: 8.7 mg/dL — ABNORMAL LOW (ref 8.9–10.3)
Chloride: 99 mmol/L (ref 98–111)
Creatinine, Ser: 1.02 mg/dL (ref 0.61–1.24)
GFR calc Af Amer: >60 mL/min
GFR calc non Af Amer: >60 mL/min
Glucose, Bld: 359 mg/dL — ABNORMAL HIGH (ref 70–99)
Potassium: 5.3 mmol/L — ABNORMAL HIGH (ref 3.5–5.1)
Sodium: 137 mmol/L (ref 135–145)
Total Bilirubin: 0.5 mg/dL (ref 0.3–1.2)
Total Protein: 5.5 g/dL — ABNORMAL LOW (ref 6.5–8.1)

## 2020-01-29 LAB — C-REACTIVE PROTEIN: CRP: 2.2 mg/dL — ABNORMAL HIGH

## 2020-01-29 LAB — GLUCOSE, CAPILLARY
Glucose-Capillary: 418 mg/dL — ABNORMAL HIGH (ref 70–99)
Glucose-Capillary: 448 mg/dL — ABNORMAL HIGH (ref 70–99)
Glucose-Capillary: 313 mg/dL — ABNORMAL HIGH (ref 70–99)
Glucose-Capillary: 382 mg/dL — ABNORMAL HIGH (ref 70–99)
Glucose-Capillary: 278 mg/dL — ABNORMAL HIGH (ref 70–99)

## 2020-01-29 LAB — CULTURE, BLOOD (ROUTINE X 2)
Culture: NO GROWTH
Culture: NO GROWTH
Special Requests: ADEQUATE

## 2020-01-29 LAB — D-DIMER, QUANTITATIVE: D-Dimer, Quant: 0.75 ug{FEU}/mL — ABNORMAL HIGH (ref 0.00–0.50)

## 2020-01-29 LAB — MAGNESIUM: Magnesium: 2.5 mg/dL — ABNORMAL HIGH (ref 1.7–2.4)

## 2020-01-29 MED ORDER — INSULIN DETEMIR 100 UNIT/ML ~~LOC~~ SOLN
18.0000 [IU] | Freq: Two times a day (BID) | SUBCUTANEOUS | Status: DC
  Administered 2020-01-29 – 2020-01-30 (×3): 18 [IU] via SUBCUTANEOUS
  Filled 2020-01-29 (×4): qty 0.18

## 2020-01-29 MED ORDER — INSULIN ASPART 100 UNIT/ML ~~LOC~~ SOLN
30.0000 [IU] | Freq: Three times a day (TID) | SUBCUTANEOUS | Status: DC
  Administered 2020-01-29 – 2020-01-30 (×5): 30 [IU] via SUBCUTANEOUS

## 2020-01-29 NOTE — Progress Notes (Signed)
Inpatient Diabetes Program Recommendations  AACE/ADA: New Consensus Statement on Inpatient Glycemic Control   Target Ranges:  Prepandial:   less than 140 mg/dL      Peak postprandial:   less than 180 mg/dL (1-2 hours)      Critically ill patients:  140 - 180 mg/dL  Results for William [name], William [name] (MRN [id number]) as of 01/29/2020 13:34  Ref. Range 01/29/2020 07:46 01/29/2020 11:53  Glucose-Capillary Latest Ref Range: 70 - 99 mg/dL 313 (H) 382 (H)   Results for William [name], William [name] (MRN [id number]) as of 01/29/2020 13:34  Ref. Range 01/28/2020 07:18 01/28/2020 11:53 01/28/2020 15:58 01/28/2020 18:24 01/28/2020 22:24  Glucose-Capillary Latest Ref Range: 70 - 99 mg/dL 212 (H) 438 (H) 441 (H) 366 (H) 255 (H)  Results for William [name], William [name] (MRN [id number]) as of 01/29/2020 13:34  Ref. Range 01/24/2020 12:27  Hemoglobin A1C Latest Ref Range: 4.8 - 5.6 % 12.2 (H)   Review of Glycemic Control  Diabetes history: DM2 Outpatient Diabetes medications: 70/30 15-20 units 1-3 times a day (depending on glucose), Metformin 1000 mg/dl Current orders for Inpatient glycemic control: Levemir 18 units BID, Novolog 30 units TID with meals, Novolog 0-20 units TID with meals, Novolog 0-5 units QHS, Tradjenta 5 mg daily; Solumedrol 35 mg Q12H  Inpatient Diabetes Program Recommendations:   Insulin: Noted Levemir and Novolog meal coverage were increased today.  HbgA1C:  A1C 12.2% on 01/24/20 indicating an average glucose of 303 mg/dl over the past 2-3 months.  NOTE: Spoke with patient over the phone with interpreter [phone]) about diabetes and home regimen for diabetes control. Patient reports he does not have a PCP nor insurance. Patient use to go to a clinic in the past.  Patient reports that he is taking 70/30 insulin and Metformin 1000 mg BID. Patient states he is takes 70/30 15-20 units when he needs it based on how his glucose is running. Unclear how often patient is actually taking 70/30 insulin per day.  Patient reports that he  is getting 70/30 insulin from [facility]. Patient initially reported he checks his glucose 3-4 times a week and it is rarely over 200 mg/dl. Discussed checking glucose 3-4 times a day (before meals and at bedtime) and then patient reported he is checking it 2-3 times a day. Discussed 70/30 insulin and how it works and is typically taken BID with breakfast and supper). Inquired about knowledge about an A1C and patient does not know what an A1C is. Explained what an A1C is and discussed A1C results (12.2% on 01/24/20 ) and explained that current A1C indicates an average glucose of 303 mg/dl over the past 2-3 months. Discussed glucose and A1C goals. Discussed importance of checking CBGs and maintaining good CBG control to prevent long-term and short-term complications. Explained how hyperglycemia leads to damage within blood vessels which lead to the common complications seen with uncontrolled diabetes. Stressed to the patient the importance of improving glycemic control to prevent further complications from uncontrolled diabetes. Discussed impact of nutrition, exercise, stress, sickness, and medications on diabetes control. Patient reports that he follows a carb modified diet and that he would like to talk with RD while inpatient. Discussed steroids currently ordered and impact of steroids on glycemic control. Informed patient that TOC was consulted and should be working with him to arrange follow up and with medications if needed.  Encouraged patient to check glucose 4 times per day, to take DM medication as prescribed, to follow up with a PCP consistently.  Patient verbalized understanding of information  discussed and reports no further questions at this time related to diabetes. Consult for RD ordered and modified TOC consult order to ask for assistance with follow up.  Thanks, Orlando Penner, RN, MSN, CDE Diabetes Coordinator Inpatient Diabetes Program [phone] (Team Pager)

## 2020-01-29 NOTE — Progress Notes (Signed)
PROGRESS NOTE  William [name] FTD:[id number] DOB: [birth year] DOA: 01/24/2020 PCP: Patient, No Pcp Per   LOS: 5 days   Brief Narrative / Interim history: 69 yo Spanish speaking male with history of DM2, HLD who came to [facility] with cough, chills, dyspnea, found to be hypoxic with sats in the 70s on room air requiring 4 L nasal cannula.  He tested positive for COVID-19, chest x-ray showed multifocal pneumonia and he was admitted to the hospital.  Subjective / 24h Interval events: Denies shortness of breath, sats in the mid to upper 80s-lower 90s while talking to me.  No chest pain, no cough or chest congestion.  Assessment & Plan:  Principal Problem Acute Hypoxic Respiratory Failure due to Covid-19 Viral Illness -Patient admitted to the hospital with hypoxic respiratory failure satting in the mid 70s on room air.  He was admitted on 4 L, oxygen requirements increased to 8 L on 2/18 briefly but now improved -He appears to be comfortable at rest on room air however with minimal ambulation he drops into the 70s -He is on steroids, continue -Status post 5 days of remdesivir -He did have leukocytosis and slightly elevated procalcitonin on arrival, status post 5 days of antibiotics with ceftriaxone and azithromycin -Status post Actemra 2/18 -Desatted pretty significantly and was quite weak with ambulation yesterday, he is still hypoxic, will repeat a chest x-ray today, ambulate more, anticipate home discharge within 24 hours and perhaps he will need home oxygen  COVID-19 Labs  Recent Labs    01/27/20 0144 01/28/20 0315 01/29/20 0150  DDIMER 0.95* 0.64* 0.75*  CRP 4.6* 2.6* 2.2*    Lab Results  Component Value Date   SARSCOV2NAA Detected (A) 01/16/2020   Active Problems Type 2 diabetes mellitus, uncontrolled, with steroid-induced hyperglycemia -A1c 12 -Patient with persistent elevated CBGs, for unclear reasons he is receiving high sugar content nutritional supplements with his  meals, have asked bedside RN to hold them -CBGs have been a persistent problem despite aggressive increase in insulin regimen, will up more today  CBG (last 3)  Recent Labs    01/28/20 1824 01/28/20 2224 01/29/20 0746  GLUCAP 366* 255* 313*    Scheduled Meds: . albuterol  2 puff Inhalation Q6H  . vitamin C  500 mg Oral Daily  . azithromycin  500 mg Oral Daily  . enoxaparin (LOVENOX) injection  40 mg Subcutaneous Q24H  . folic acid  1 mg Oral Daily  . guaiFENesin  600 mg Oral BID  . insulin aspart  0-20 Units Subcutaneous TID WC  . insulin aspart  0-5 Units Subcutaneous QHS  . insulin aspart  25 Units Subcutaneous TID WC  . insulin detemir  15 Units Subcutaneous BID  . linagliptin  5 mg Oral Daily  . living well with diabetes book- in spanish   Does not apply Once  . mouth rinse  15 mL Mouth Rinse BID  . methylPREDNISolone (SOLU-MEDROL) injection  35 mg Intravenous Q12H  . multivitamin with minerals  1 tablet Oral Daily  . sodium chloride flush  3 mL Intravenous Q12H  . thiamine  100 mg Oral Daily  . zinc sulfate  220 mg Oral Daily   Continuous Infusions: . cefTRIAXone (ROCEPHIN)  IV Stopped (01/28/20 1303)   PRN Meds:.chlorpheniramine-HYDROcodone, guaiFENesin-dextromethorphan, polyethylene glycol, sodium chloride flush  DVT prophylaxis: Lovenox Code Status: Full code Family Communication: Discussed with patient, updated daughter over the phone 2/20 Patient admitted from: Home Anticipated d/c place: Home Barriers to d/c: Persistent hypoxia, anticipate  home discharge within 24 hours if stable  Consultants:  None  Procedures:  None   Microbiology: None   Antibacterials: Ceftriaxone / Azithromycin 2/17>> 2/21  Objective: Vitals:   01/29/20 0750 01/29/20 0800 01/29/20 0900 01/29/20 0925  BP: (!) 168/90 (!) 141/88    Pulse: 88 88 (!) 102 (!) 101  Resp: 20     Temp: 97.9 F (36.6 C)     TempSrc: Oral     SpO2: 94% 92% (!) 86% (!) 87%  Weight:      Height:         Intake/Output Summary (Last 24 hours) at 01/29/2020 0630 Last data filed at 01/29/2020 0751 Gross per 24 hour  Intake --  Output 2850 ml  Net -2850 ml   Filed Weights   01/24/20 0831  Weight: 63.5 kg    Examination:  Constitutional: NAD, comfortable Eyes: No scleral icterus ENMT: Moist mucous membranes Neck: normal, supple Respiratory: Diminished at the bases, no wheezing, no crackles good air movement  Cardiovascular: Regular rate and rhythm, no murmurs Abdomen: Soft, bowel sounds positive Musculoskeletal: no clubbing / cyanosis.  Skin: No rashes seen Neurologic: No focal deficits  Data Reviewed: I have independently reviewed following labs and imaging studies   CBC: Recent Labs  Lab 01/25/20 0250 01/26/20 0105 01/27/20 0144 01/28/20 0315 01/29/20 0150  WBC 11.2* 6.1 7.2 9.5 12.5*  NEUTROABS 9.8* 5.5 6.5 8.6* 11.4*  HGB 11.9* 11.7* 11.7* 11.9* 11.7*  HCT 35.0* 34.7* 34.6* 34.5* 35.0*  MCV 89.5 90.8 91.1 91.5 90.9  PLT 339 388 460* 470* 160*   Basic Metabolic Panel: Recent Labs  Lab 01/25/20 0250 01/25/20 0250 01/26/20 0105 01/27/20 0144 01/27/20 1210 01/28/20 0315 01/29/20 0150  NA 137  --  136 137  --  139 137  K 4.3  --  4.4 4.4  --  4.6 5.3*  CL 103  --  102 103  --  102 99  CO2 25  --  26 25  --  28 30  GLUCOSE 240*   < > 161* 178* 460* 220* 359*  BUN 29*  --  27* 30*  --  39* 50*  CREATININE 0.89  --  0.72 0.61  --  0.92 1.02  CALCIUM 7.9*  --  7.8* 8.4*  --  8.5* 8.7*  MG 2.7*  --  2.4 2.5*  --  2.5* 2.5*   < > = values in this interval not displayed.   GFR: Estimated Creatinine Clearance: 55 mL/min (by C-G formula based on SCr of 1.02 mg/dL). Liver Function Tests: Recent Labs  Lab 01/25/20 0250 01/26/20 0105 01/27/20 0144 01/28/20 0315 01/29/20 0150  AST 22 27 23 31 25   ALT 14 16 17 28 26   ALKPHOS 74 74 85 96 120  BILITOT 0.7 0.5 0.3 0.4 0.5  PROT 6.7 6.1* 5.8* 5.7* 5.5*  ALBUMIN 2.8* 2.6* 2.6* 2.6* 2.7*   No results for  input(s): LIPASE, AMYLASE in the last 168 hours. No results for input(s): AMMONIA in the last 168 hours. Coagulation Profile: No results for input(s): INR, PROTIME in the last 168 hours. Cardiac Enzymes: No results for input(s): CKTOTAL, CKMB, CKMBINDEX, TROPONINI in the last 168 hours. BNP (last 3 results) No results for input(s): PROBNP in the last 8760 hours. HbA1C: No results for input(s): HGBA1C in the last 72 hours. CBG: Recent Labs  Lab 01/28/20 1153 01/28/20 1558 01/28/20 1824 01/28/20 2224 01/29/20 0746  GLUCAP 438* 441* 366* 255* 313*   Lipid Profile:  No results for input(s): CHOL, HDL, LDLCALC, TRIG, CHOLHDL, LDLDIRECT in the last 72 hours. Thyroid Function Tests: No results for input(s): TSH, T4TOTAL, FREET4, T3FREE, THYROIDAB in the last 72 hours. Anemia Panel: No results for input(s): VITAMINB12, FOLATE, FERRITIN, TIBC, IRON, RETICCTPCT in the last 72 hours. Urine analysis:    Component Value Date/Time   COLORURINE YELLOW 03/22/2014 1130   APPEARANCEUR CLEAR 03/22/2014 1130   LABSPEC 1.010 03/22/2014 1130   PHURINE 5.5 03/22/2014 1130   GLUCOSEU >1000 (A) 03/22/2014 1130   HGBUR NEGATIVE 03/22/2014 1130   BILIRUBINUR NEGATIVE 03/22/2014 1130   KETONESUR NEGATIVE 03/22/2014 1130   PROTEINUR NEGATIVE 03/22/2014 1130   UROBILINOGEN 0.2 03/22/2014 1130   NITRITE NEGATIVE 03/22/2014 1130   LEUKOCYTESUR NEGATIVE 03/22/2014 1130   Sepsis Labs: Invalid input(s): PROCALCITONIN, LACTICIDVEN  Recent Results (from the past 240 hour(s))  Blood Culture (routine x 2)     Status: None   Collection Time: 01/24/20  9:37 AM   Specimen: BLOOD RIGHT ARM  Result Value Ref Range Status   Specimen Description BLOOD RIGHT ARM  Final   Special Requests   Final    BOTTLES DRAWN AEROBIC AND ANAEROBIC Blood Culture adequate volume   Culture   Final    NO GROWTH 5 DAYS Performed at [facility], [address] [place], [place] [address]    Report Status 01/29/2020 FINAL   Final  Blood Culture (routine x 2)     Status: None   Collection Time: 01/24/20  9:38 AM   Specimen: BLOOD LEFT ARM  Result Value Ref Range Status   Specimen Description BLOOD LEFT ARM  Final   Special Requests   Final    BOTTLES DRAWN AEROBIC AND ANAEROBIC Blood Culture results may not be optimal due to an excessive volume of blood received in culture bottles   Culture   Final    NO GROWTH 5 DAYS Performed at [facility], [address] [place], [place] [address]    Report Status 01/29/2020 FINAL  Final      Radiology Studies: No results found.  Pamella Pert, MD, PhD [facility]  Between 7 am - 7 pm I am available, please contact me via Amion or Securechat  Between 7 pm - 7 am I am not available, please contact night coverage MD/APP via Amion

## 2020-01-30 DIAGNOSIS — R0902 Hypoxemia: Secondary | ICD-10-CM

## 2020-01-30 LAB — GLUCOSE, CAPILLARY
Glucose-Capillary: 230 mg/dL — ABNORMAL HIGH (ref 70–99)
Glucose-Capillary: 211 mg/dL — ABNORMAL HIGH (ref 70–99)
Glucose-Capillary: 219 mg/dL — ABNORMAL HIGH (ref 70–99)
Glucose-Capillary: 230 mg/dL — ABNORMAL HIGH (ref 70–99)
Glucose-Capillary: 240 mg/dL — ABNORMAL HIGH (ref 70–99)

## 2020-01-30 MED ORDER — DEXAMETHASONE 6 MG PO TABS: 6.0000 mg | ORAL_TABLET | Freq: Every day | ORAL | 0 refills | Status: AC

## 2020-01-30 MED ORDER — NOVOLIN 70/30 (70-30) 100 UNIT/ML ~~LOC~~ SUSP: 20.0000 [IU] | Freq: Two times a day (BID) | SUBCUTANEOUS | 4 refills | Status: AC

## 2020-01-30 MED FILL — DEXAMETHASONE 6 MG TABLET: 6 | 4 days supply | Qty: 4 | Fill #0

## 2020-01-30 MED FILL — TRUEPLUS SYR 0.5ML 30GX5/16: 30G X 5/16" | 30 days supply | Qty: 60 | Fill #0

## 2020-01-30 MED FILL — NOVOLIN 70/30 100 UNITS/ML: (70-30) 100 | 25 days supply | Qty: 10 | Fill #0

## 2020-01-30 NOTE — Progress Notes (Signed)
1530 Assumed care of pt.  Pt lying in bed alert and oriented x3.  Pt waiting to be discharged  O2 currently at the door.  Family to arrive around 5pm per report

## 2020-01-30 NOTE — Plan of Care (Signed)
  Problem: Education: Goal: Knowledge of risk factors and measures for prevention of condition will improve Outcome: Progressing   Problem: Coping: Goal: Psychosocial and spiritual needs will be supported Outcome: Progressing   Problem: Respiratory: Goal: Will maintain a patent airway Outcome: Progressing Goal: Complications related to the disease process, condition or treatment will be avoided or minimized Outcome: Progressing   Problem: Education: Goal: Ability to describe self-care measures that may prevent or decrease complications (Diabetes Survival Skills Education) will improve Outcome: Progressing Goal: Individualized Educational Video(s) Outcome: Progressing   Problem: Fluid Volume: Goal: Ability to maintain a balanced intake and output will improve Outcome: Progressing

## 2020-01-30 NOTE — Progress Notes (Signed)
SATURATION QUALIFICATIONS: (This note is used to comply with regulatory documentation for home oxygen)  Patient Saturations on Room Air at Rest = 93%  Patient Saturations on Room Air while Ambulating = 84%  Patient Saturations on 3 Liters of oxygen while Ambulating = 93%  Please briefly explain why patient needs home oxygen: Patient has decreased oxygen level below 90% with ambulation or activity.

## 2020-01-30 NOTE — Discharge Summary (Signed)
Physician Discharge Summary  [name] IRS:[id number] DOB: [birth year] DOA: 01/24/2020  PCP: Patient, No Pcp Per  Admit date: 01/24/2020 Discharge date: 01/30/2020  Admitted From: home Disposition:  home  Recommendations for Outpatient Follow-up:  1. Follow up with PCP in 1-2 weeks 2. Patient discharged with home O2 3. Continue steroids for 4 additional days  Home Health: none Equipment/Devices: home O2  Discharge Condition: stable CODE STATUS: Full code Diet recommendation: diabetic   HPI: Per admitting MD, William [name]  is a 69 y.o. male mostly Spanish-speaking with past medical history relevant for DM2, HLD who presents with cough, chills, dyspnea and found to have hypoxia with O2 sats of 78% on room air in the ED -Required 4 L of oxygen to get his O2 sats above 90% language barrier noted, patient mostly Spanish-speaking, interpreter utilized, I called and spoke with patient's daughter Ms Sims Laday [phone], questions answered -Patient's daughter apparently tested positive for Covid and was symptomatic around 01/09/2020, patient started feeling sick around 01/17/2020 Patient's cough, respiratory symptoms and chills worsened over the last 24 hours -Elevated inflammatory markers noted including -CRP 14.5, ferritin 762, fibrinogen 697, LDH 477, D-dimer 3.85 ,  Triglycerides  193 - WBC of 14.0, with absolute neutrophil of 12.3, procalcitonin is 1.12, as well as Lactic acid of 3.2 , down to 1.9 after hydration--- -Creatinine is 1.24, bicarb is 28 and anion gap is 14 --- Chest x-ray consistent with pneumonia -No vomiting or diarrhea  Hospital Course / Discharge diagnoses:  Principal Problem Acute Hypoxic Respiratory Failure due to Covid-19 Viral Illness -Patient admitted to the hospital with hypoxic respiratory failure satting in the mid 70s on room air.  He was admitted on 4 L, oxygen requirements increased to 8 L on 2/18 briefly but now improved.  He completed a 5-day course  of remdesivir.  He was also started on steroids, completed 6 days and one half for additional details, beyond the time of discharge.  He had leukocytosis and slightly elevated procalcitonin and also completed 5 days of antibiotics with ceftriaxone and azithromycin.  He also received Actemra on 2/18.  Clinically he has improved, his shortness of breath resolved, he is able to ambulate in the hallways without further shortness of breath, chest pain or difficulties.  His oxygen saturation are still low and he is requiring oxygen with ambulation on discharge.  COVID-19 Labs  Recent Labs    01/28/20 0315 01/29/20 0150  DDIMER 0.64* 0.75*  CRP 2.6* 2.2*    Lab Results  Component Value Date   SARSCOV2NAA Detected (A) 01/16/2020   Active Problems Type 2 diabetes mellitus, uncontrolled, with steroid-induced hyperglycemia -A1c 12, patient will need better control of his diabetes at home, he was instructed to check his CBGs 4 times daily as well as give him some insulin twice daily which he was not doing before. He was set up for follow up with [facility] within 1-2 weeks as below.   Discharge Instructions   Allergies as of 01/30/2020   No Known Allergies     Medication List    TAKE these medications   acetaminophen 500 MG tablet Commonly known as: TYLENOL Take 500-1,000 mg by mouth every 6 (six) hours as needed (for pain.).   dexamethasone 6 MG tablet Commonly known as: DECADRON Take 1 tablet (6 mg total) by mouth daily.   metFORMIN 1000 MG tablet Commonly known as: GLUCOPHAGE 1 po bid.  Tome una tableta por [employer] veces diarias con  comida What changed:   how much to take  how to take this  when to take this  additional instructions   NovoLIN 70/30 (70-30) 100 UNIT/ML injection Generic drug: insulin NPH-regular Human Inject 20 Units into the skin 2 (two) times daily with a meal. Inject 15 units into the skin twice daily with a meal.   inyecte 20  unidades a la piel dos veces al dia con el alimento What changed:   how much to take  how to take this  when to take this  additional instructions   nystatin cream Commonly known as: MYCOSTATIN Apply thin film to affected areas bid.  aplique tira delgada a la area [employer] al dia What changed:   how much to take  how to take this  when to take this  reasons to take this  additional instructions            Durable Medical Equipment  (From admission, onward)         Start     Ordered   01/30/20 1147  For home use only DME oxygen  Once    Question Answer Comment  Length of Need 6 Months   Mode or (Route) Nasal cannula   Liters per Minute 3   Frequency Continuous (stationary and portable oxygen unit needed)   Oxygen delivery system Gas      01/30/20 1146         Follow-up Information    [facility] Follow up on 02/08/2020.   Why: @ 2:30pm for hospital follow-up. This will be a telephone appointment Contact information: [address] [facility] [id number] [phone]       [facility] Follow up.   Why: agency will provide home oxygen Contact information: [address] [place] [address] [phone]          Consultations:  None   Procedures/Studies:  DG CHEST PORT 1 VIEW  Result Date: 01/29/2020 CLINICAL DATA:  Hypoxemia. EXAM: PORTABLE CHEST 1 VIEW COMPARISON:  01/24/2020 FINDINGS: Patchy bilateral airspace opacities with a slight peripheral predominance and right greater than left distribution are similar to prior study. No pleural effusion. Cardiopericardial silhouette is at upper limits of normal for size. The visualized bony structures of the thorax are intact. IMPRESSION: Stable appearance patchy bilateral airspace opacity. Electronically Signed   By: Kennith Center M.D.   On: 01/29/2020 10:17   DG Chest Port 1 View  Result Date: 01/24/2020 CLINICAL DATA:   History of COVID-19 positivity with persistent weakness and fatigue EXAM: PORTABLE CHEST 1 VIEW COMPARISON:  None. FINDINGS: Cardiac shadow is within normal limits. The lungs are well aerated bilaterally. Patchy opacities are identified bilaterally consistent with the given clinical history. No bony abnormality is seen. IMPRESSION: Patchy opacities consistent with the clinical history of COVID-19 positivity. Electronically Signed   By: Alcide Clever M.D.   On: 01/24/2020 09:36     Subjective: - no chest pain, shortness of breath, no abdominal pain, nausea or vomiting.   Discharge Exam: BP 116/70 (BP Location: Right Arm)   Pulse 92   Temp 98.8 F (37.1 C) (Oral)   Resp 16   Ht 5\' 2"  (1.575 m)   Wt 63.5 kg   SpO2 98%   BMI 25.61 kg/m   General: Pt is alert, awake, not in acute distress Cardiovascular: RRR, S1/S2 +, no rubs, no gallops Respiratory: CTA bilaterally, no wheezing, no rhonchi Abdominal: Soft, NT,  ND, bowel sounds + Extremities: no edema, no cyanosis    The results of significant diagnostics from this hospitalization (including imaging, microbiology, ancillary and laboratory) are listed below for reference.     Microbiology: Recent Results (from the past 240 hour(s))  Blood Culture (routine x 2)     Status: None   Collection Time: 01/24/20  9:37 AM   Specimen: BLOOD RIGHT ARM  Result Value Ref Range Status   Specimen Description BLOOD RIGHT ARM  Final   Special Requests   Final    BOTTLES DRAWN AEROBIC AND ANAEROBIC Blood Culture adequate volume   Culture   Final    NO GROWTH 5 DAYS Performed at [facility], [address], [place], [place] [address]    Report Status 01/29/2020 FINAL  Final  Blood Culture (routine x 2)     Status: None   Collection Time: 01/24/20  9:38 AM   Specimen: BLOOD LEFT ARM  Result Value Ref Range Status   Specimen Description BLOOD LEFT ARM  Final   Special Requests   Final    BOTTLES DRAWN AEROBIC AND ANAEROBIC Blood Culture results  may not be optimal due to an excessive volume of blood received in culture bottles   Culture   Final    NO GROWTH 5 DAYS Performed at [facility], [address], [place], [place] [address]    Report Status 01/29/2020 FINAL  Final     Labs: Basic Metabolic Panel: Recent Labs  Lab 01/25/20 0250 01/25/20 0250 01/26/20 0105 01/27/20 0144 01/27/20 1210 01/28/20 0315 01/29/20 0150  NA 137  --  136 137  --  139 137  K 4.3  --  4.4 4.4  --  4.6 5.3*  CL 103  --  102 103  --  102 99  CO2 25  --  26 25  --  28 30  GLUCOSE 240*   < > 161* 178* 460* 220* 359*  BUN 29*  --  27* 30*  --  39* 50*  CREATININE 0.89  --  0.72 0.61  --  0.92 1.02  CALCIUM 7.9*  --  7.8* 8.4*  --  8.5* 8.7*  MG 2.7*  --  2.4 2.5*  --  2.5* 2.5*   < > = values in this interval not displayed.   Liver Function Tests: Recent Labs  Lab 01/25/20 0250 01/26/20 0105 01/27/20 0144 01/28/20 0315 01/29/20 0150  AST 22 27 23 31 25   ALT 14 16 17 28 26   ALKPHOS 74 74 85 96 120  BILITOT 0.7 0.5 0.3 0.4 0.5  PROT 6.7 6.1* 5.8* 5.7* 5.5*  ALBUMIN 2.8* 2.6* 2.6* 2.6* 2.7*   CBC: Recent Labs  Lab 01/25/20 0250 01/26/20 0105 01/27/20 0144 01/28/20 0315 01/29/20 0150  WBC 11.2* 6.1 7.2 9.5 12.5*  NEUTROABS 9.8* 5.5 6.5 8.6* 11.4*  HGB 11.9* 11.7* 11.7* 11.9* 11.7*  HCT 35.0* 34.7* 34.6* 34.5* 35.0*  MCV 89.5 90.8 91.1 91.5 90.9  PLT 339 388 460* 470* 523*   CBG: Recent Labs  Lab 01/29/20 1630 01/29/20 2040 01/29/20 2144 01/30/20 0728 01/30/20 1140  GLUCAP 278* 230* 211* 219* 230*   Hgb A1c No results for input(s): HGBA1C in the last 72 hours. Lipid Profile No results for input(s): CHOL, HDL, LDLCALC, TRIG, CHOLHDL, LDLDIRECT in the last 72 hours. Thyroid function studies No results for input(s): TSH, T4TOTAL, T3FREE, THYROIDAB in the last 72 hours.  Invalid input(s): FREET3 Urinalysis    Component Value Date/Time  COLORURINE YELLOW 03/22/2014 1130   APPEARANCEUR CLEAR 03/22/2014 1130    LABSPEC 1.010 03/22/2014 1130   PHURINE 5.5 03/22/2014 1130   GLUCOSEU >1000 (A) 03/22/2014 1130   HGBUR NEGATIVE 03/22/2014 1130   BILIRUBINUR NEGATIVE 03/22/2014 1130   KETONESUR NEGATIVE 03/22/2014 1130   PROTEINUR NEGATIVE 03/22/2014 1130   UROBILINOGEN 0.2 03/22/2014 1130   NITRITE NEGATIVE 03/22/2014 1130   LEUKOCYTESUR NEGATIVE 03/22/2014 1130    FURTHER DISCHARGE INSTRUCTIONS:   Get Medicines reviewed and adjusted: Please take all your medications with you for your next visit with your Primary MD   Laboratory/radiological data: Please request your Primary MD to go over all hospital tests and procedure/radiological results at the follow up, please ask your Primary MD to get all Hospital records sent to his/her office.   In some cases, they will be blood work, cultures and biopsy results pending at the time of your discharge. Please request that your primary care M.D. goes through all the records of your hospital data and follows up on these results.   Also Note the following: If you experience worsening of your admission symptoms, develop shortness of breath, life threatening emergency, suicidal or homicidal thoughts you must seek medical attention immediately by calling 911 or calling your MD immediately  if symptoms less severe.   You must read complete instructions/literature along with all the possible adverse reactions/side effects for all the Medicines you take and that have been prescribed to you. Take any new Medicines after you have completely understood and accpet all the possible adverse reactions/side effects.    Do not drive when taking Pain medications or sleeping medications (Benzodaizepines)   Do not take more than prescribed Pain, Sleep and Anxiety Medications. It is not advisable to combine anxiety,sleep and pain medications without talking with your primary care practitioner   Special Instructions: If you have smoked or chewed Tobacco  in the last 2 yrs please  stop smoking, stop any regular Alcohol  and or any Recreational drug use.   Wear Seat belts while driving.   Please note: You were cared for by a hospitalist during your hospital stay. Once you are discharged, your primary care physician will handle any further medical issues. Please note that NO REFILLS for any discharge medications will be authorized once you are discharged, as it is imperative that you return to your primary care physician (or establish a relationship with a primary care physician if you do not have one) for your post hospital discharge needs so that they can reassess your need for medications and monitor your lab values.  Time coordinating discharge: 40 minutes  SIGNED:  Marzetta Board, MD, PhD 01/30/2020, 1:08 PM

## 2020-01-30 NOTE — TOC Progression Note (Signed)
Transition of Care [facility]) - Progression Note    Patient Details  Name: William [name] MRN: [id number] Date of Birth: 06-Oct-1951  Transition of Care [facility]) CM/SW Contact  Armanda Heritage, RN Phone Number: 01/30/2020, 12:55 PM  Clinical Narrative:    [facility] to provide home oxygen. Patient will be staying with his son at [address] [place] [address], sons name is William [name] [phone]).  AC to deliver portable tank to bedside for dc.    Expected Discharge Plan: Home/Self Care Barriers to Discharge: No Barriers Identified  Expected Discharge Plan and Services Expected Discharge Plan: Home/Self Care   Discharge Planning Services: CM Consult   Living arrangements for the past 2 months: Single Family Home                 DME Arranged: Oxygen DME Agency: [facility] Date DME Agency Contacted: 01/30/20 Time DME Agency Contacted: 1255 Representative spoke with at DME Agency: Ian Malkin HH Arranged: NA HH Agency: NA         Social Determinants of Health (SDOH) Interventions    Readmission Risk Interventions No flowsheet data found.

## 2020-01-30 NOTE — TOC Progression Note (Signed)
Transition of Care [facility]) - Progression Note    Patient Details  Name: William [name] MRN: [id number] Date of Birth: [birth year]  [facility] CM/SW Contact  Armanda Heritage, RN Phone Number: 01/30/2020, 2:49 PM  Clinical Narrative:    Match letter provided to [facility] pharmacy. Discharge prescriptions to be delivered to bedside.     Expected Discharge Plan: Home/Self Care Barriers to Discharge: No Barriers Identified  Expected Discharge Plan and Services Expected Discharge Plan: Home/Self Care   Discharge Planning Services: CM Consult   Living arrangements for the past 2 months: Single Family Home Expected Discharge Date: 01/30/20               DME Arranged: Oxygen DME Agency: [employer] Date DME Agency Contacted: 01/30/20 Time DME Agency Contacted: [phone] Representative spoke with at DME Agency: Ian Malkin HH Arranged: NA HH Agency: NA         Social Determinants of Health (SDOH) Interventions    Readmission Risk Interventions No flowsheet data found.

## 2020-01-30 NOTE — Progress Notes (Signed)
Inpatient Diabetes Program Recommendations  AACE/ADA: New Consensus Statement on Inpatient Glycemic Control   Target Ranges:  Prepandial:   less than 140 mg/dL      Peak postprandial:   less than 180 mg/dL (1-2 hours)      Critically ill patients:  140 - 180 mg/dL   Results for DYLIN, BREEDEN (MRN [id number]) as of 01/30/2020 11:31  Ref. Range 01/29/2020 07:46 01/29/2020 11:53 01/29/2020 16:30 01/29/2020 20:40 01/29/2020 21:44 01/30/2020 07:28  Glucose-Capillary Latest Ref Range: 70 - 99 mg/dL 092 (H) 330 (H) 076 (H) 230 (H) 211 (H) 219 (H)   Review of Glycemic Control  Diabetes history: DM2 Outpatient Diabetes medications: 70/30 15-20 units 1-3 times a day (depending on glucose), Metformin 1000 mg/dl Current orders for Inpatient glycemic control: Levemir 18 units BID, Novolog 30 units TID with meals, Novolog 0-20 units TID with meals, Novolog 0-5 units QHS, Tradjenta 5 mg daily; Solumedrol 35 mg Q12H  Inpatient Diabetes Program Recommendations:   Insulin: If steroids are continued as ordered, please consider increasing Levemir to 25 units BID and meal coverage to Novolog 35 units TID with meals.  HbgA1C:  A1C 12.2% on 01/24/20 indicating an average glucose of 303 mg/dl over the past 2-3 months. In talking with patient on 01/29/20 it is unclear how often patient is actually taking 70/30. Would recommend adjusting outpatient 70/30 dose at time of discharge and have patient take it BID with breakfast and supper. Asked patient to get PCP and follow up consistently.   Thanks, Orlando Penner, RN, MSN, CDE Diabetes Coordinator Inpatient Diabetes Program [phone] (Team Pager from 8am to 5pm)

## 2020-01-30 NOTE — Progress Notes (Signed)
Pt ordered to be discharged.  At this timer pt is alert and oriented, vital signs are stable. Pt egressed the unit with belongings, discharge instructions, oxygen,and prescriptions. Pt acknowledged understanding. Pt family educated on discharge instructions in addition. Pt family acknowledged understanding.

## 2020-01-30 NOTE — Discharge Instructions (Signed)
Follow with [facility] in 2 to 3 weeks  Please get a complete blood count and chemistry panel checked by your Primary MD at your next visit, and again as instructed by your Primary MD. Please get your medications reviewed and adjusted by your Primary MD.  Please request your Primary MD to go over all Hospital Tests and Procedure/Radiological results at the follow up, please get all Hospital records sent to your Prim MD by signing hospital release before you go home.  In some cases, there will be blood work, cultures and biopsy results pending at the time of your discharge. Please request that your primary care M.D. goes through all the records of your hospital data and follows up on these results.  If you had Pneumonia of Lung problems at the Hospital: Please get a 2 view Chest X ray done in 6-8 weeks after hospital discharge or sooner if instructed by your Primary MD.  If you have Congestive Heart Failure: Please call your Cardiologist or Primary MD anytime you have any of the following symptoms:  1) 3 pound weight gain in 24 hours or 5 pounds in 1 week  2) shortness of breath, with or without a dry hacking cough  3) swelling in the hands, feet or stomach  4) if you have to sleep on extra pillows at night in order to breathe  Follow cardiac low salt diet and 1.5 lit/day fluid restriction.  If you have diabetes Accuchecks 4 times/day, Once in AM empty stomach and then before each meal. Log in all results and show them to your primary doctor at your next visit. If any glucose reading is under 80 or above 300 call your primary MD immediately.  If you have Seizure/Convulsions/Epilepsy: Please do not drive, operate heavy machinery, participate in activities at heights or participate in high speed sports until you have seen by Primary MD or a Neurologist and advised to do so again. Per [place] [facility] statutes, patients with seizures are not allowed to  drive until they have been seizure-free for six months.  Use caution when using heavy equipment or power tools. Avoid working on ladders or at heights. Take showers instead of baths. Ensure the water temperature is not too high on the home water heater. Do not go swimming alone. Do not lock yourself in a room alone (i.e. bathroom). When caring for infants or small children, sit down when holding, feeding, or changing them to minimize risk of injury to the child in the event you have a seizure. Maintain good sleep hygiene. Avoid alcohol.   If you had Gastrointestinal Bleeding: Please ask your Primary MD to check a complete blood count within one week of discharge or at your next visit. Your endoscopic/colonoscopic biopsies that are pending at the time of discharge, will also need to followed by your Primary MD.  Get Medicines reviewed and adjusted. Please take all your medications with you for your next visit with your Primary MD  Please request your Primary MD to go over all hospital tests and procedure/radiological results at the follow up, please ask your Primary MD to get all Hospital records sent to his/her office.  If you experience worsening of your admission symptoms, develop shortness of breath, life threatening emergency, suicidal or homicidal thoughts you must seek medical attention immediately by calling 911 or calling your MD immediately  if symptoms less severe.  You must read complete instructions/literature along with all the possible adverse reactions/side effects  for all the Medicines you take and that have been prescribed to you. Take any new Medicines after you have completely understood and accpet all the possible adverse reactions/side effects.   Do not drive or operate heavy machinery when taking Pain medications.   Do not take more than prescribed Pain, Sleep and Anxiety Medications  Special Instructions: If you have smoked or chewed Tobacco  in the last 2 yrs please stop  smoking, stop any regular Alcohol  and or any Recreational drug use.  Wear Seat belts while driving.  Please note You were cared for by a hospitalist during your hospital stay. If you have any questions about your discharge medications or the care you received while you were in the hospital after you are discharged, you can call the unit and asked to speak with the hospitalist on call if the hospitalist that took care of you is not available. Once you are discharged, your primary care physician will handle any further medical issues. Please note that NO REFILLS for any discharge medications will be authorized once you are discharged, as it is imperative that you return to your primary care physician (or establish a relationship with a primary care physician if you do not have one) for your aftercare needs so that they can reassess your need for medications and monitor your lab values.  You can reach the hospitalist office at phone [phone] or fax [phone]   If you do not have a primary care physician, you can call [phone] for a physician referral.  Activity: As tolerated with Full fall precautions use walker/cane & assistance as needed    Diet: diabetic  Disposition Home     COVID-19 COVID-19 El COVID-19 es una infeccin respiratoria causada por un virus llamado coronavirus tipo 2 causante del sndrome respiratorio agudo grave (SARS-CoV-2). La enfermedad tambin se conoce como enfermedad por coronavirus o nuevo coronavirus. En algunas personas, el virus puede no ocasionar sntomas. En otras, puede producir una infeccin grave. La infeccin puede empeorar rpidamente y causar complicaciones, como:  Neumona o infeccin en los pulmones.  Sndrome de dificultad respiratoria aguda o SDRA. Es una afeccin que se caracteriza por la acumulacin de lquido en los pulmones, que impide que los pulmones se llenen de aire y pasen oxgeno a Risk manager.  Insuficiencia respiratoria aguda. Es una  afeccin que se caracteriza porque no pasa suficiente oxgeno de los pulmones al cuerpo o porque el dixido de carbono no pasa de los pulmones hacia afuera del cuerpo.  Sepsis o choque sptico. Se trata de una reaccin grave del cuerpo ante una infeccin.  Problemas de coagulacin.  Infecciones secundarias debido a bacterias u hongos.  Falla de rganos. Ocurre cuando los rganos del cuerpo dejan de funcionar. El virus que causa el COVID-19 es contagioso. Esto significa que puede transmitirse de [place] persona a otra a travs de las gotitas de saliva de la tos y de los estornudos (secreciones respiratorias). Cules son las causas? Esta enfermedad es causada por un virus. Usted puede contagiarse con este virus:  Al inspirar las gotitas de una persona infectada. Las BJ's pueden diseminarse cuando una persona respira, habla, canta, tose o estornuda.  Al tocar algo, como una mesa o el picaportes de [place], que estuvo expuesto al virus (contaminado) y luego tocarse la boca, nariz o los ojos. Qu incrementa el riesgo? Riesgo de infeccin Es ms probable que se infecte con este virus si:  Se encuentra a una distancia menor a 6 pies (2 metros)  de una persona con COVID-19.  Cuida o vive con una persona infectada con COVID-19.  Pasa tiempo en espacios interiores repletos de gente o vive en viviendas compartidas. Riesgo de enfermedad grave Es ms probable que se enferme gravemente por el virus si:  Tiene 52aos o ms. Cuanto mayor sea su edad, mayor ser el riesgo de tener una enfermedad grave.  Vive en un hogar de ancianos o centro de atencin a Air cabin crew.  Tiene cncer.  Tiene una enfermedad prolongada (crnica), como las siguientes: ? Enfermedad pulmonar crnica, que incluye la enfermedad pulmonar obstructiva crnica o asma. ? Una enfermedad crnica que disminuye la capacidad del cuerpo para combatir las infecciones (immunocomprometido). ? Enfermedad cardaca, que incluye  insuficiencia cardaca, una afeccin que se caracteriza porque las arterias que llegan al corazn se Engineer, technical sales u obstruyen (arteriopata coronaria) o una enfermedad que provoca que el msculo cardaco se engrose, se debilite o endurezca (miocardiopata). ? Diabetes. ? Enfermedad renal crnica. ? Anemia drepanoctica, una enfermedad que se caracteriza porque los glbulos rojos tienen una forma anormal de "hoz". ? Enfermedad heptica.  Es obeso. Cules son los signos o sntomas? Los sntomas de esta afeccin pueden ser de leves a graves. Los sntomas pueden aparecer en el trmino de 2 a [address] despus de haber estado expuesto al virus. Incluyen los siguientes:  Fiebre o escalofros.  Tos.  Dificultad para respirar.  Dolores de [place], dolores en el cuerpo o dolores musculares.  Secrecin o congestin nasal.  Dolor de garganta.  Nueva prdida del sentido del gusto o del olfato. Algunas personas tambin pueden Mattel, como nuseas, vmitos o diarrea. Es posible que otras personas no tengan sntomas de COVID-19. Cmo se diagnostica? Esta afeccin se puede diagnosticar en funcin de lo siguiente:  Sus signos y sntomas, especialmente si: ? Vive en una zona donde hay un brote de COVID-19. ? Viaj recientemente a una zona donde el virus es frecuente. ? Cuida o vive con Neomia Dear persona a quien se le diagnostic COVID-19. ? Odelia Gage expuesto a una persona a la que se le diagnostic COVID-19.  Un examen fsico.  Anlisis de laboratorio que pueden incluir: ? Tomar una muestra de lquido de la parte posterior de la nariz y la garganta (lquido nasofarngeo), la nariz o la garganta, con un hisopo. ? Una muestra de mucosidad de los pulmones (esputo). ? Anlisis de [place].  Los estudios de diagnstico por imgenes pueden incluir radiografas, exploracin por tomografa computarizada (TC) o ecografa. Cmo se trata? En este momento, no hay ningn medicamento para tratar  el COVID-19. Los medicamentos para tratar otras enfermedades se usan a modo de ensayo para comprobar si son eficaces contra el COVID-19. El mdico le informar sobre las maneras de tratar los sntomas. En la [employer], la infeccin es leve y puede controlarse en el hogar con reposo, lquidos y medicamentos de [place]. El tratamiento para una infeccin grave suele realizarse en la unidad de cuidados intensivos (UCI) de un hospital. Puede incluir uno o ms de los siguientes. Estos tratamientos se administran hasta que los sntomas mejoran.  Recibir lquidos y [employer] a travs de una va intravenosa.  Oxgeno complementario. Para administrar oxgeno extra, se [place] un tubo en la Darene Lamer, una mascarilla o una campana de oxgeno.  Colocarlo para que se recueste boca abajo (decbito prono). Esto facilita el ingreso de oxgeno a los pulmones.  Uso continuo de [place] de presin positiva de las vas areas (CPAP) o de presin positiva de  las vas areas de dos niveles (BPAP). Este tratamiento utiliza una presin de aire leve para Pharmacologist las vas respiratorias abiertas. Un tubo conectado a un motor administra oxgeno al cuerpo.  Respirador. Este tratamiento mueve el aire dentro y fuera de los pulmones mediante el uso de un tubo que se coloca en la trquea.  Traqueostoma. En este procedimiento se hace un orificio en el cuello para insertar un tubo de respiracin.  Oxigenacin por membrana extracorprea (OMEC). En este procedimiento, los pulmones tienen la posibilidad de recuperarse al asumir las funciones del corazn y los pulmones. Suministra oxgeno al cuerpo y elimina el dixido de carbono. Siga estas instrucciones en su casa: Estilo de vida  Si est enfermo, qudese en su casa, excepto para obtener atencin mdica. El mdico le indicar cunto tiempo debe quedarse en casa. Llame al mdico antes de buscar atencin mdica.  Haga reposo en su casa como se lo haya indicado  el mdico.  No consuma ningn producto que contenga nicotina o tabaco, como cigarrillos, cigarrillos electrnicos y tabaco de Theatre manager. Si necesita ayuda para dejar de fumar, consulte al mdico.  Retome sus actividades normales segn lo indicado por el mdico. Pregntele al mdico qu actividades son seguras para usted. Instrucciones generales  Use los medicamentos de venta libre y los recetados solamente como se lo haya indicado el mdico.  Beba suficiente lquido como para Pharmacologist la orina de color amarillo plido.  Concurra a todas las visitas de [address] se lo haya indicado el mdico. Esto es importante. Cmo se evita?  No hay ninguna vacuna que ayude a prevenir la infeccin por COVID-19. Sin embargo, hay medidas que puede tomar para protegerse y Conservator, museum/gallery a Economist de este virus. Para protegerse:   No viaje a zonas donde el COVID-19 sea un riesgo. Las zonas donde se informa la presencia del COVID-19 cambian con frecuencia. Para identificar las zonas de alto riesgo y las restricciones de viaje, consulte el sitio web de viajes de Building control surveyor for [employer] Technology and Prevention Insurance claims handler) (Centros para el Control y la Prevencin de Event organiser): [email or web address]  Si vive o debe viajar a una zona donde el COVID-19 es un riesgo, tome precauciones para evitar infecciones. ? Aljese de [employer]. ? Lvese las manos frecuentemente con agua y [place]. Use desinfectante para manos con alcohol si no dispone de [place] y [place]. ? Evite tocarse la boca, la cara, los ojos o la [place]. ? Evite salir de su casa, siga las indicaciones de su estado y de las autoridades sanitarias locales. ? Si debe salir de su casa, use un barbijo de tela o una mascarilla facial. Asegrese de que le cubra la nariz y la boca. ? Evite los espacios interiores repletos de gente. Mantenga una distancia de al menos 6 pies (2 metros) de Economist. ? Desinfecte los objetos y  las superficies que se tocan con frecuencia todos [place]. Pueden incluir:  Encimeras y [place].  Picaportes e interruptores de luz.  Lavabos, fregaderos y grifos.  Aparatos electrnicos tales como telfonos, controles remotos, teclados, computadoras y tabletas. Cmo proteger a los dems: Si tiene sntomas de COVID-19, tome medidas para evitar que el virus se propague a Economist.  Si cree que tiene una infeccin por COVID-19, comunquese de inmediato con su mdico. Informe al equipo de atencin mdica que cree que puede tener una infeccin por el COVID-19.  Qudese en su casa. Salga de su casa solo para buscar atencin mdica. No utilice el  transporte pblico.  No viaje mientras est enfermo.  Lvese las manos frecuentemente con agua y [place]n durante 20segundos. Usar desinfectante para manos con alcohol si no dispone de [place] y [place].  Mantngase alejado de quienes vivan con usted. Permita que los miembros de la familia sanos cuiden a los nios y las [place]ot[place], si es posible. Si tiene que cuidar a los nios o las mascotas, lvese las manos con frecuencia y use un barbijo. Si es posible, permanezca en su habitacin, separado de los dems. Utilice un bao diferente.  Asegrese de que todas las personas que viven en su casa se laven bien las manos y con frecuencia.  Tosa o estornude en un pauelo de papel o sobre su manga o codo. No tosa o estornude al aire ni se cubra la boca o la nariz con la [place].  Use un barbijo de tela o una mascarilla facial. Asegrese de que le cubra la nariz y la boca. Dnde buscar ms informacin  Centers for Disease Control and Prevention (Centros para el Control y la Prevencin de Event organisernfermedades): StickerEmporium.tnwww.cdc.gov/coronavirus/2019-ncov/index.html  World Health Organization (Organizacin Mundial de la Salud): https://thompson-craig.com/www.who.int/health-topics/coronavirus Comunquese con un mdico si:  Vive o ha viajado a una zona donde el COVID-19 es un riesgo y tiene sntomas de  infeccin.  Ha tenido contacto con alguien que tiene COVID-19 y usted tiene sntomas de infeccin. Solicite ayuda inmediatamente si:  Tiene dificultad para respirar.  Siente dolor u opresin en el pecho.  Experimenta confusin.  Tiene las uas de los dedos y los labios de color Clintonazulado.  Tiene dificultad para despertarse.  Los sntomas empeoran. Estos sntomas pueden representar un problema grave que constituye Radio broadcast assistantuna emergencia. No espere a ver si los sntomas desaparecen. Solicite atencin mdica de inmediato. Comunquese con el servicio de emergencias de su localidad (911 en los [place]). No conduzca por sus propios medios Dollar Generalhasta el hospital. Informe al personal mdico de emergencias si cree que tiene COVID-19. Resumen  El COVID-19 es una infeccin respiratoria causada por un virus. Tambin se conoce como enfermedad por coronavirus o nuevo coronavirus. Puede causar infecciones graves, como neumona, sndrome de dificultad respiratoria aguda, insuficiencia respiratoria aguda o sepsis.  El virus que causa el COVID-19 es contagioso. Esto significa que puede transmitirse de [place] persona a otra a travs de las gotitas que se despiden al respirar, Heritage managerhablar, cantar, toser y Engineering geologistestornudar.  Es ms probable que desarrolle una enfermedad grave si tiene 50 aos o ms, tiene el sistema inmunitario dbil, vive en un hogar de ancianos o tiene una enfermedad crnica.  No hay ningn medicamento para tratar el COVID-19. El mdico le informar sobre las maneras de tratar los sntomas.  Tome medidas para protegerse y Conservator, museum/galleryproteger a los Merchandiser, retaildems contra las infecciones. Lvese las manos con frecuencia y desinfecte los objetos y las superficies que se tocan con frecuencia todos [place]los das. Mantngase alejado de las personas que estn enfermas y use un barbijo si est enfermo. Esta informacin no tiene Theme park managercomo fin reemplazar el consejo del mdico. Asegrese de hacerle al mdico cualquier pregunta que tenga. Document  Revised: 09/28/2019 Document Reviewed: 01/21/2019 Elsevier Patient Education  2020 Elsevier Inc.   Preguntas frecuentes sobre el COVID-19 COVID-19 Frequently Asked Questions El COVID-19 (enfermedad por coronavirus) es una infeccin causada por una gran familia de virus. Algunos virus causan National Cityenfermedades en las personas y otros causan enfermedades en animales tales como los camellos, los gatos y los murcilagos. En algunos casos, los virus que causan New York Life Insuranceenfermedades en los animales pueden  transmitirse a los [employer]ellation Brands. De dnde provino el coronavirus? En diciembre de 2019, [place] le inform a Chief Technology Officer (Organizacin Mundial de la [place], Best boy) acerca de varios casos de enfermedad pulmonar (enfermedad respiratoria humana). Estos casos estaban vinculados con un mercado abierto de frutos de mar y [place] en la ciudad de [place]. El vnculo con el mercado de ganado y [employer] sugiere que el virus puede haberse propagado de los animales a los [place]. Sin embargo, desde Chiropodist brote en diciembre, tambin se ha demostrado que el virus se contagia de [place] persona a Educational psychologist. Cul es el nombre de la enfermedad y del virus? Nombre de la enfermedad Al principio, esta enfermedad se llam nuevo coronavirus. Esto se debe a que los cientficos determinaron que la enfermedad era causada por un nuevo virus respiratorio. [employer] (Organizacin Mundial de la [place], [place]) ahora ha dado a la enfermedad el nombre de COVID-19, o enfermedad por coronavirus. Nombre del virus El virus causante de la enfermedad se conoce como coronavirus de tipo 2 causante del sndrome respiratorio agudo grave (SARS-CoV-2). Ms informacin sobre el nombre de la enfermedad y el virus Workd Health Organization (Organizacin Mundial de la [place]) (OMS): www.who.int/emergencies/diseases/novel-coronavirus-2019/technical-guidance/naming-the-coronavirus-disease-(covid-2019)-and-the-virus-that-causes-it Quines estn  en riesgo de sufrir complicaciones debido a la enfermedad por coronavirus? Algunas personas pueden tener un riesgo ms alto de tener complicaciones debido a la enfermedad por coronavirus. Entre ellas se encuentran los [employer] y las personas que tienen enfermedades crnicas, como enfermedad cardaca, diabetes y enfermedad pulmonar. Si tiene un riesgo ms alto de Sales executive, tome estas precauciones adicionales:  Personal assistant en su casa todo lo que sea posible.  Evitar las reuniones sociales y los viajes.  Evitar el contacto cercano con Economist. Permanecer a una distancia de al menos 6 pies (2 m) de las [employer], si es posible.  Lavarse las manos frecuentemente con agua y [place] durante al menos 20segundos.  Evitar tocarse la cara, la boca, la nariz y los ojos.  Tener a [employer] su casa, como alimentos, medicamentos y productos de limpieza.  Si debe salir de su casa, use un barbijo de tela o una mascarilla facial. Asegrese de que le cubra la nariz y la boca. Cmo se transmite la enfermedad causada por el coronavirus? El virus que causa la enfermedad por coronavirus se transmite fcilmente de Neomia Dear persona a otra (es contagioso). Usted puede contagiarse con este virus:  Al inspirar las gotitas de una persona infectada. Las BJ's pueden diseminarse cuando una persona respira, habla, canta, tose o estornuda.  Al tocar algo, como una mesa o el picaportes de [place], que estuvo expuesto al virus (contaminado) y luego tocarse la boca, nariz o los ojos. Puedo contraer al virus al tocar superficies u objetos? Todava hay mucho que no se conoce acerca del virus que causa la enfermedad por coronavirus. Los cientficos basan gran parte de la informacin en lo que saben sobre virus similares, por ejemplo:  En general, los virus no sobreviven en superficies durante mucho tiempo. Necesitan un cuerpo humano (husped) para sobrevivir.  Es ms probable que el  virus se contagie por contacto cercano con personas que estn enfermas (contacto directo), por ejemplo: ? Al estrechar las manos o abrazarse. ? Al inhalar las gotitas respiratorias que se desplazan por el aire. Las BJ's pueden diseminarse cuando una persona respira, habla, canta, tose o estornuda.  Es menos probable que el virus se propague cuando una persona toca una superficie o un  objeto sobre el que est el virus (contacto indirecto). El virus puede ingresar al cuerpo si la persona toca una superficie o un objeto y [employer]s se toca la cara, los ojos, la nariz o la boca. Una persona puede contagiar el virus sin tener sntomas de la enfermedad? Puede ser posible que el virus se contagie antes de que la persona tenga sntomas de la enfermedad, pero muy probablemente esta no sea la principal forma en que el virus se est propagando. Es ms probable que el virus se propague al estar en contacto estrecho con personas que estn enfermas e inhalar las gotas respiratorias que una persona disemina al respirar, Heritage manager, cantar, toser o estornudar. Cules son los sntomas de la enfermedad causada por el coronavirus? Los sntomas varan de Neomia Dear persona a otra y pueden variar de leves a graves. [employer], se pueden incluir los siguientes:  Teacher, English as a foreign language o escalofros.  Tos.  Dificultad para respirar o falta de aire.  Dolores de [place], dolores en el cuerpo o dolores musculares.  Secrecin o congestin nasal.  El dolor de garganta.  Nueva prdida del sentido del gusto o del olfato.  Nuseas, vmitos o diarrea. Estos sntomas pueden aparecer en el trmino de 2 a [address] despus de haber estado expuesto al virus. Algunas personas quizs no tengan sntomas. Si presenta sntomas, llame al mdico. Las personas con sntomas graves pueden necesitar atencin hospitalaria. Debo hacerme un anlisis de deteccin del virus? El mdico decidir si debe realizarse un anlisis en funcin de sus sntomas,  antecedentes de exposicin y factores de [place]. Cmo realiza el mdico el anlisis para detectar este virus? Los mdicos obtienen muestras para enviar a Chiropractor. Estas muestras pueden incluir lo siguiente:  Tomar con un hisopo Lauris Poag de lquido de la parte posterior de la nariz y la garganta, la nariz o la garganta.  Pedirle que tosa mucosidad (esputo) para extraer lquido de los pulmones en un recipiente estril.  Tomar una muestra de [place]. Hay algn tratamiento o vacuna para este virus? Actualmente, no existe ninguna vacuna para prevenir la enfermedad por coronavirus. Adems, no existen [employer] antibiticos o los antivirales para tratar el virus. Una persona que se enferma recibe tratamiento de apoyo, lo que significa reposo y lquidos. Una persona tambin puede aliviar sus sntomas con medicamentos de venta libre para tratar los estornudos, la tos y el goteo nasal. Son los mismos medicamentos que se toman para el resfro comn. Si presenta sntomas, llame al mdico. Las personas con sntomas graves pueden necesitar atencin hospitalaria. Qu puedo hacer para protegerme y proteger a mi familia de este virus?     Puede protegerse y proteger a su familia tomando las mismas medidas que tomara para prevenir el contagio de otros virus. [employer] las siguientes medidas:  Lavarse las manos frecuentemente con agua y [place] durante al menos 20segundos. Usar desinfectante para manos con alcohol si no dispone de [place] y [place].  Evitar tocarse la cara, la boca, la nariz y los ojos.  Toser o estornudar en un pauelo descartable, sobre su manga o codo. No toser o estornudar al aire ni cubrirse con la [place]. ? Si tose o estornuda en un pauelo de papel, deschelo inmediatamente y [employer].  Desinfectar los [employer] y las superficies que se tocan con frecuencia todos [place].  Aljese de [employer].  Evite salir de su casa, siga las indicaciones de su estado y de  las autoridades sanitarias locales.  Evite los espacios interiores  repletos de gente. Permanezca a una distancia de al menos 6 pies (2 m) de las Nucor Corporationotras personas.  Si debe salir de su casa, use un barbijo de tela o una mascarilla facial. Asegrese de que le cubra la nariz y la boca.  Lennie HummerQuedarse en su casa si est enfermo, excepto para obtener atencin mdica. Llame al mdico antes de buscar atencin mdica. El mdico le indicar cunto tiempo debe quedarse en casa.  Asegrese de EchoStartener las vacunas al da. Pregntele al mdico qu vacunas necesita. Qu debo hacer si tengo que viajar? Siga las recomendaciones relacionadas con los viajes de la autoridad de Psychiatristsalud local, los CDC y Engineer, civil (consulting)la OMS. Informacin y consejos para Nurse, adultviajeros  Centers for Disease Control and Prevention Insurance claims handler(CDC) (Centros para el Control y la Prevencin de Event organisernfermedades): GeminiCard.glwww.cdc.gov/coronavirus/2019-ncov/travelers/index.html  Organizacin Mundial de Radiographer, therapeuticla Salud (OMS): PreviewDomains.sewww.who.int/emergencies/diseases/novel-coronavirus-2019/travel-advice Fisher ScientificConozca los riesgos y tome medidas para proteger su salud  El riesgo de Primary school teachercontraer la enfermedad por coronavirus es ms alto si viaja a zonas con un brote o si est en contacto con viajeros que provienen de zonas donde hay un brote.  Lvese las manos con frecuencia y Spainmantenga una higiene [place] Jan May[place] Islandsadecuada para reducir el riesgo de contagiarse o transmitir el virus. Qu debo hacer si estoy enfermo? Instrucciones generales para detener la propagacin de la infeccin  Lavarse las manos frecuentemente con agua y jabn durante al menos 20segundos. Usar desinfectante para manos con alcohol si no dispone de [place] y [place].  Toser o estornudar en un pauelo descartable, sobre su manga o codo. No toser o estornudar al aire ni cubrirse con la [place].  Si tose o estornuda en un pauelo de papel, deschelo inmediatamente y Verizonlvese las manos.  Lanny HurstQudese en su casa a menos que deba recibir Computer Sciences Corporationatencin mdica. Llame al mdico o a la  autoridad de salud local antes de buscar atencin mdica.  Evite las zonas pblicas. No viaje en transporte pblico, de ser posible.  Si puede, use un barbijo si debe salir de la casa o si est en contacto cercano con alguien que no est enfermo. Asegrese de que le cubra la nariz y la boca. Mantenga su casa limpia  Desinfecte los objetos y las superficies que se tocan con frecuencia todos [place] das. Pueden incluir: ? Encimeras y [place]. ? Picaportes e interruptores de luz. ? Lavabos, fregaderos y grifos. ? Aparatos electrnicos tales como telfonos, controles remotos, teclados, computadoras y tabletas.  Lave los platos con agua jabonosa caliente o en el lavavajillas. Deje los platos para que se sequen al aire.  Lave la ropa con agua caliente. Evite infectar a otros miembros de la familia  Permita que los miembros de la familia sanos cuiden a los nios y las [place]ascotas, si es posible. Si tiene que cuidar a los nios o las mascotas, lvese las manos con frecuencia y use un barbijo.  Duerma en una habitacin o cama diferentes, si es posible.  No comparta elementos personales, como afeitadoras, cepillos de dientes, desodorantes, peines, cepillos, toallas y [address] Mell[address] Finaltoallitas de [address] Streetmano. Dnde buscar ms informacin Centers for Disease Control and Prevention (CDC)  Actualizaciones de informacin y novedades: CardRetirement.czwww.cdc.gov/coronavirus/2019-ncov Organizacin Mundial de la Salud (OMS)  Actualizaciones de informacin y novedades: AffordableSalon.eswww.who.int/emergencies/diseases/novel-coronavirus-2019  Tema de salud relacionado con el coronavirus: https://thompson-craig.com/www.who.int/health-topics/coronavirus  Preguntas y Environmental health practitionerrespuestas sobre COVID-19: kruiseway.comwww.who.int/news-room/q-a-detail/q-a-coronaviruses  Registro mundial: who.sprinklr.com American Academy of Pediatrics (AAP) (Academia Estadounidense de Pediatra)  Informacin para familias: www.healthychildren.org/English/health-issues/conditions/chest-lungs/Pages/2019-Novel-Coronavirus.aspx La  situacin del coronavirus cambia rpidamente. Consulte el sitio web de su autoridad de Psychiatristsalud local o  los sitios web de los CDC y la OMS para enterarse de las novedades y noticias. Cundo debo comunicarme con un mdico?  Comunquese con su mdico si tiene sntomas de infeccin, como fiebre o tos, y: ? Arlean Hopping cerca de alguien que sabe que tiene la enfermedad por coronavirus. ? Arlean Hopping en contacto con una persona que presuntamente sufra de la enfermedad por coronavirus. ? Ha viajado a una zona donde hay un brote de COVID-19. Cundo debo buscar asistencia mdica inmediata?  Busque ayuda de inmediato llamando al servicio de emergencias de su localidad (911 en los [place]) si tiene lo siguiente: ? Dificultad para respirar. ? Dolor u opresin en el pecho. ? Confusin. ? Labios y uas de [employer]. ? Dificultad para despertarse. ? Sntomas que empeoran. Informe al personal mdico de emergencias si cree que tiene la enfermedad por coronavirus. Resumen  Un nuevo virus respiratorio se propaga de Neomia Dear persona a otra y causa COVID-19 (enfermedad por coronavirus).  El virus que causa el COVID-19 parece diseminarse fcilmente. Se transmite de [place] persona a otra a travs de las [employer] se despiden al respirar, Heritage manager, cantar, toser o estornudar.  Los [employer] y las personas que tienen enfermedades crnicas tienen mayor riesgo de Writer enfermedad. Si tiene un riesgo ms alto de tener complicaciones, tome Engineer, materials.  Actualmente, no existe ninguna vacuna para prevenir la enfermedad por coronavirus. No existen medicamentos, como los antibiticos o los antivirales, para tratar el virus.  Puede protegerse y proteger a su familia al lavarse las manos con frecuencia, evitar tocarse la cara y cubrirse al toser y Engineering geologist. Esta informacin no tiene Theme park manager el consejo del mdico. Asegrese de hacerle al mdico cualquier pregunta que tenga. Document  Revised: 09/28/2019 Document Reviewed: 04/03/2019 Elsevier Patient Education  2020 Elsevier Inc.   Hipoxemia Hypoxemia  La hipoxemia ocurre cuando la sangre no tiene suficiente oxgeno. En esta situacin, el cuerpo no puede funcionar bien porque cada parte del organismo necesita oxgeno. El oxgeno [employer] pulmones cuando respiramos y, [place], viaja a todas las partes del cuerpo a travs de Risk manager. La hipoxemia puede presentarse de forma repentina o aparecer lentamente. Cules son las causas? Las causas ms frecuentes de esta afeccin incluyen las siguientes:  Enfermedades pulmonares de Set designer duracin (crnicas) como en la enfermedad pulmonar obstructiva crnica (EPOC) o enfermedad pulmonar intersticial.  Afecciones que puedan afectar la respiracin por la noche, como la apnea del sueo.  Acumulacin de lquido en los pulmones (edema pulmonar).  Infeccin pulmonar (neumona).  Cncer de pulmn o garganta.  Flujo sanguneo anormal que esquiva los pulmones (tener una derivacin).  Ciertas enfermedades que [employer] Corporation nervios o los msculos.  Un pulmn colapsado (neumotrax).  Cogulo de [employer] pulmones (embolia pulmonar).  Ciertos tipos de enfermedades del corazn.  Respiracin lenta o superficial (hipoventilacin).  Ciertos medicamentos.  Las alturas.  Productos qumicos, humo y gases txicos. Cules son los signos o los sntomas? En algunos casos, puede no haber sntomas de esta afeccin. Si tiene sntomas, pueden incluir los siguientes:  Falta de aire (disnea).  Color azulado de la piel, los labios o las uas.  Respiracin rpida, ruidosa o poco profunda.  Latidos cardacos acelerados.  Sentirse cansado o con sueo.  Sensacin de confusin o preocupacin. Si la hipoxemia se presenta rpidamente, es probable que tenga disnea. Si la hipoxemia se presenta lentamente durante meses o aos, es probable que no note ningn sntoma. Cmo se  diagnostica? Esta  afeccin se diagnostica mediante:  Un examen fsico.  Anlisis de sangre.  Una prueba que mide el porcentaje de oxgeno en la sangre (oximetra de pulso). Esto se hace con un sensor que se coloca en un dedo de la mano, un dedo del pie o el lbulo de la [place]. Cmo se trata? El tratamiento de esta afeccin depende de la causa subyacente de la hipoxemia. Es probable que reciba tratamiento de oxigenoterapia para Clinical cytogeneticist nivel de oxgeno de Risk manager. Segn sea la causa de su hipoxemia, necesitar oxigenoterapia durante un perodo breve (semanas o meses) o podr Pension scheme manager recibir este tratamiento por el resto de su vida. El mdico tambin podr indicar otros tratamientos para solucionar la causa subyacente de su hipoxemia. Siga estas instrucciones en su casa:   Tome los medicamentos de venta libre y los recetados solamente como se lo haya indicado el mdico.  Si recibe tratamiento de oxigenoterapia, siga las precauciones de seguridad del oxgeno segn lo indicado por su mdico. Estos medicamentos pueden incluir: ? Tenga siempre una reserva de oxgeno. ? No permita que nadie fume ni inicie un fuego alrededor del oxgeno. ? Maneje con cuidado los tanques de oxgeno y segn se lo hayan indicado.  No consuma ningn producto que contenga nicotina o tabaco, como cigarrillos y Administrator, Civil Service. Si necesita ayuda para dejar de consumir, consulte al mdico. Aljese de las personas que fuman.  Concurra a todas las visitas de control como se lo haya indicado el mdico. Esto es importante. Comunquese con un mdico si:  Tiene preguntas o preocupaciones relacionadas con la oxigenoterapia.  Tiene dificultad para respirar, incluso durante o despus del tratamiento.  Le falta el aire al hacer ejercicios.  Se siente cansado al despertarse.  Siente dolor de cabeza al despertarse. Solicite ayuda inmediatamente si:  La falta de aire empeora, especialmente con actividad  normal o mnima.  Tiene un color azulado en la piel, los labios o las uas.  Se siente confundido o no puede pensar correctamente.  Escupe moco de color oscuro o sangre.  Siente dolor en el pecho.  Tiene fiebre. Resumen  La hipoxemia ocurre cuando la sangre no tiene suficiente oxgeno.  La hipoxemia puede o no causar sntomas. A menudo, el principal sntoma es la falta de aire (disnea).  Segn sea la causa de su hipoxemia, necesitar oxigenoterapia durante un perodo breve (semanas o meses) o podr Pension scheme manager recibir este tratamiento por el resto de su vida.  Si recibe tratamiento de oxigenoterapia, siga las precauciones de seguridad del oxgeno segn lo indicado por su mdico. Esta informacin no tiene Theme park manager el consejo del mdico. Asegrese de hacerle al mdico cualquier pregunta que tenga. Document Revised: 10/27/2018 Document Reviewed: 10/27/2018 Elsevier Patient Education  2020 [employer].

## 2020-02-08 ENCOUNTER — Ambulatory Visit: Payer: Self-pay | Attending: Family Medicine | Admitting: Physician Assistant

## 2020-02-08 ENCOUNTER — Other Ambulatory Visit: Payer: Self-pay

## 2020-02-19 NOTE — Progress Notes (Deleted)
Patient ID: William [name], male   DOB: [birth year], 69 y.o.   MRN: [id number]   after hospitalization 2/17-2/23/2021 for Covid 19: HPI: Per admitting MD, SantosVillaresis a68 y.o.malemostly Spanish-speaking with past medical history relevant for DM2, HLD who presents with cough, chills, dyspnea and found to have hypoxia with O2 sats of 78% on room air in the ED -Required 4 L of oxygen to get his O2 sats above 90% language barrier noted, patient mostly Spanish-speaking, interpreter utilized,Icalled and spoke with patient's daughter Ms Olan Kurek [phone] answered -Patient's daughter apparently tested positive for Covid and was symptomatic around 01/09/2020,patient started feeling sick around 01/17/2020 Patient's cough, respiratory symptoms and chills worsened over the last 24 hours -Elevated inflammatory markers noted including -CRP 14.5, ferritin 762,fibrinogen 697,LDH 477, D-dimer 3.85, Triglycerides 193 -WBC of 14.0,with absolute neutrophil of 12.3, procalcitonin is 1.12, as well asLactic acid of 3.2,down to 1.9 after hydration--- -Creatinine is 1.24,bicarb is 28 and anion gap is 14 ---Chest x-ray consistent with pneumonia -No vomiting or diarrhea  Hospital Course / Discharge diagnoses:  Principal Problem Acute Hypoxic Respiratory Failure due to Covid-19 Viral Illness -Patient admitted to the hospital with hypoxic respiratory failure satting in the mid 70s on room air. He was admitted on 4 L, oxygen requirements increased to 8 L on 2/18 briefly but now improved.  He completed a 5-day course of remdesivir.  He was also started on steroids, completed 6 days and one half for additional details, beyond the time of discharge.  He had leukocytosis and slightly elevated procalcitonin and also completed 5 days of antibiotics with ceftriaxone and azithromycin.  He also received Actemra on 2/18.  Clinically he has improved, his shortness of breath resolved, he is able to  ambulate in the hallways without further shortness of breath, chest pain or difficulties.  His oxygen saturation are still low and he is requiring oxygen with ambulation on discharge.  COVID-19 Labs  Recent Labs (last 2 labs)       Recent Labs    01/28/20 0315 01/29/20 0150  DDIMER 0.64* 0.75*  CRP 2.6* 2.2*      Recent Labs       Lab Results  Component Value Date   SARSCOV2NAA Detected (A) 01/16/2020     Active Problems Type 2 diabetes mellitus, uncontrolled, with steroid-induced hyperglycemia -A1c 12, patient will need better control of his diabetes at home, he was instructed to check his CBGs 4 times daily as well as give him some insulin twice daily which he was not doing before. He was set up for follow up with [facility] within 1-2 weeks as below.

## 2020-02-21 ENCOUNTER — Other Ambulatory Visit: Payer: Self-pay

## 2020-02-21 ENCOUNTER — Ambulatory Visit: Payer: Self-pay | Attending: Family Medicine | Admitting: Physician Assistant

## 2020-02-21 DIAGNOSIS — U071 COVID-19: Secondary | ICD-10-CM

## 2020-02-21 NOTE — Progress Notes (Signed)
This encounter was created in error - please disregard.

## 2020-03-21 ENCOUNTER — Telehealth: Payer: Self-pay

## 2020-03-21 ENCOUNTER — Other Ambulatory Visit: Payer: Self-pay

## 2020-03-21 ENCOUNTER — Ambulatory Visit: Payer: Self-pay | Attending: Family Medicine | Admitting: Family Medicine

## 2020-03-25 NOTE — Telephone Encounter (Signed)
ERROR

## 2020-07-23 ENCOUNTER — Other Ambulatory Visit (HOSPITAL_COMMUNITY): Payer: Self-pay | Admitting: *Deleted

## 2020-07-23 ENCOUNTER — Ambulatory Visit (HOSPITAL_COMMUNITY)
Admission: RE | Admit: 2020-07-23 | Discharge: 2020-07-23 | Disposition: A | Discharge status: Home / Self Care | Payer: Self-pay | Source: Ambulatory Visit | Attending: *Deleted | Admitting: *Deleted

## 2020-07-23 ENCOUNTER — Other Ambulatory Visit: Payer: Self-pay

## 2020-07-23 DIAGNOSIS — E1169 Type 2 diabetes mellitus with other specified complication: Secondary | ICD-10-CM | POA: Insufficient documentation

## 2020-07-23 DIAGNOSIS — L97509 Non-pressure chronic ulcer of other part of unspecified foot with unspecified severity: Secondary | ICD-10-CM

## 2020-07-23 DIAGNOSIS — E11621 Type 2 diabetes mellitus with foot ulcer: Secondary | ICD-10-CM | POA: Insufficient documentation

## 2020-07-23 DIAGNOSIS — M869 Osteomyelitis, unspecified: Secondary | ICD-10-CM | POA: Insufficient documentation

## 2020-08-28 ENCOUNTER — Encounter (HOSPITAL_BASED_OUTPATIENT_CLINIC_OR_DEPARTMENT_OTHER): Payer: Self-pay | Attending: Physician Assistant | Admitting: Physician Assistant

## 2020-08-28 DIAGNOSIS — Z7984 Long term (current) use of oral hypoglycemic drugs: Secondary | ICD-10-CM | POA: Insufficient documentation

## 2020-08-28 DIAGNOSIS — E11621 Type 2 diabetes mellitus with foot ulcer: Secondary | ICD-10-CM | POA: Insufficient documentation

## 2020-08-28 DIAGNOSIS — L97512 Non-pressure chronic ulcer of other part of right foot with fat layer exposed: Secondary | ICD-10-CM | POA: Insufficient documentation

## 2020-08-28 NOTE — Progress Notes (Signed)
WANG, GRANADA ([id number]) Visit Report for 08/28/2020 Chief Complaint Document Details Patient Name: Date of Service: William [name], William [name] 08/28/2020 9:00 A M Medical Record Number: [id number] Patient Account Number: [id number] Date of Birth/Sex: Treating RN: October 06, 1951 (69 y.o. William [name] Primary Care Provider: Hoy Register Other Clinician: Referring Provider: Treating Provider/Extender: Shana Chute, Enobong Weeks in Treatment: 0 Information Obtained from: Patient Chief Complaint Right great toe ulcer Electronic Signature([name]) Signed: 08/28/2020 10:27:59 AM By: Lenda Kelp PA-C Previous Signature: 08/28/2020 10:27:42 AM Version By: Lenda Kelp PA-C Entered By: Lenda Kelp on 08/28/2020 10:27:58 -------------------------------------------------------------------------------- Debridement Details Patient Name: Date of Service: William [name], William [name] 08/28/2020 9:00 A M Medical Record Number: [id number] Patient Account Number: [id number] Date of Birth/Sex: Treating RN: 1951-10-26 (69 y.o. William [name] Primary Care Provider: Hoy Register Other Clinician: Referring Provider: Treating Provider/Extender: Shana Chute, Enobong Weeks in Treatment: 0 Debridement Performed for Assessment: Wound #1 Right T Great oe Performed By: Physician Lenda Kelp, PA Debridement Type: Debridement Severity of Tissue Pre Debridement: Fat layer exposed Level of Consciousness (Pre-procedure): Awake and Alert Pre-procedure Verification/Time Out Yes - 10:15 Taken: Start Time: 10:16 Pain Control: Lidocaine 5% topical ointment T Area Debrided (L x W): otal 1 (cm) x 0.8 (cm) = 0.8 (cm) Tissue and other material debrided: Viable, Non-Viable, Callus, Slough, Subcutaneous, Skin: Epidermis, Slough Level: Skin/Subcutaneous Tissue Debridement Description: Excisional Instrument: Curette Bleeding: Minimum Hemostasis Achieved: Pressure End Time:  10:19 Procedural Pain: 2 Post Procedural Pain: 1 Response to Treatment: Procedure was tolerated well Level of Consciousness (Post- Awake and Alert procedure): Post Debridement Measurements of Total Wound Length: (cm) 1 Width: (cm) 0.8 Depth: (cm) 0.2 Volume: (cm) 0.126 Character of Wound/Ulcer Post Debridement: Improved Severity of Tissue Post Debridement: Fat layer exposed Post Procedure Diagnosis Same as Pre-procedure Electronic Signature([name]) Signed: 08/28/2020 5:11:27 PM By: Zenaida Deed RN, BSN Signed: 08/28/2020 5:16:27 PM By: Lenda Kelp PA-C Entered By: Zenaida Deed on 08/28/2020 10:20:56 -------------------------------------------------------------------------------- HPI Details Patient Name: Date of Service: William [name], William [name] 08/28/2020 9:00 A M Medical Record Number: [id number] Patient Account Number: [id number] Date of Birth/Sex: Treating RN: 07/13/1951 (69 y.o. William [name] Primary Care Provider: Hoy Register Other Clinician: Referring Provider: Treating Provider/Extender: Shana Chute, Enobong Weeks in Treatment: 0 History of Present Illness HPI Description: 08/28/2020 upon evaluation today patient appears for initial evaluation here in the clinic concerning issues that he has been having with a wound on the right great toe. He is seen with the help of interpreter and his daughter is present during the evaluation today as well. He does have a history of diabetes mellitus type 2 for which she does take medication. He tells me that the wound has been present for a few weeks since at least around the beginning of August. He has been placed on doxycycline for 10 days which was on 07/23/2020. On 07/24/2020 his hemoglobin A1c was last checked and that was at 10.9. With that being said the patient tells me that he has been soaking his foot in hot water and then subsequently putting a dressing over top of this just seems to be little bit of a dry  dressing possibly antibiotic ointment. Fortunately there is no signs of systemic infection or even local infection in my opinion although he does seem to have some undermining likely due to the fact that he is getting pressure and friction to the wound surface causing  this to remain open. Electronic Signature([name]) Signed: 08/28/2020 5:03:22 PM By: Lenda Kelp PA-C Entered By: Lenda Kelp on 08/28/2020 17:03:22 -------------------------------------------------------------------------------- Physical Exam Details Patient Name: Date of Service: William [name], William [name] 08/28/2020 9:00 A M Medical Record Number: [id number] Patient Account Number: [id number] Date of Birth/Sex: Treating RN: 1951-05-20 (69 y.o. William [name] Primary Care Provider: Hoy Register Other Clinician: Referring Provider: Treating Provider/Extender: Shana Chute, Enobong Weeks in Treatment: 0 Constitutional sitting or standing blood pressure is within target range for patient.. supine blood pressure is within target range for patient.. pulse regular and within target range for patient.Marland Kitchen respirations regular, non-labored and within target range for patient.Marland Kitchen temperature within target range for patient.. Well-nourished and well-hydrated in no acute distress. Eyes conjunctiva clear no eyelid edema noted. pupils equal round and reactive to light and accommodation. Ears, Nose, Mouth, and Throat no gross abnormality of ear auricles or external auditory canals. normal hearing noted during conversation. mucus membranes moist. Respiratory normal breathing without difficulty. Cardiovascular 2+ dorsalis pedis/posterior tibialis pulses. no clubbing, cyanosis, significant edema, <3 sec cap refill. Musculoskeletal normal gait and posture. no significant deformity or arthritic changes, no loss or range of motion, no clubbing. Psychiatric this patient is able to make decisions and demonstrates good insight into  disease process. Alert and Oriented x 3. pleasant and cooperative. Notes Upon inspection patient'[name] wound bed actually appeared to be doing fairly well there was minimal slough noted that did require some sharp debridement. With that being said there did not appear to be any significant need for aggressive debridement otherwise and I did remove mainly some callus around the edges of the wound which the patient tolerated without complication. Post debridement wound did appear to be doing much better. Electronic Signature([name]) Signed: 08/28/2020 5:04:20 PM By: Lenda Kelp PA-C Entered By: Lenda Kelp on 08/28/2020 17:04:20 -------------------------------------------------------------------------------- Physician Orders Details Patient Name: Date of Service: William [name], William [name] 08/28/2020 9:00 A M Medical Record Number: [id number] Patient Account Number: [id number] Date of Birth/Sex: Treating RN: July 15, 1951 (69 y.o. William [name] Primary Care Provider: Hoy Register Other Clinician: Referring Provider: Treating Provider/Extender: Shana Chute, Enobong Weeks in Treatment: 0 Verbal / Phone Orders: No Diagnosis Coding Follow-up Appointments Return Appointment in 1 week. Dressing Change Frequency Wound #1 Right T Great oe Change Dressing every other day. Wound Cleansing Wound #1 Right T Great oe May shower and wash wound with soap and water. - wash right foot and toe with Dial antibacterial soap after shower Primary Wound Dressing Wound #1 Right T Great oe Calcium Alginate with Silver Secondary Dressing Wound #1 Right T Great oe Foam - foam donut Kerlix/Rolled Gauze Dry Gauze Off-Loading Open toe surgical shoe to: - right foot Electronic Signature([name]) Signed: 08/28/2020 5:11:27 PM By: Zenaida Deed RN, BSN Signed: 08/28/2020 5:16:27 PM By: Lenda Kelp PA-C Entered By: Zenaida Deed on 08/28/2020  10:24:12 -------------------------------------------------------------------------------- Problem List Details Patient Name: Date of Service: [name], William [name] 08/28/2020 9:00 A M Medical Record Number: [id number] Patient Account Number: [id number] Date of Birth/Sex: Treating RN: Jun 06, 1951 (70 y.o. William [name] Primary Care Provider: Hoy Register Other Clinician: Referring Provider: Treating Provider/Extender: Shana Chute, Enobong Weeks in Treatment: 0 Active Problems ICD-10 Encounter Code Description Active Date MDM Diagnosis E11.621 Type 2 diabetes mellitus with foot ulcer 08/28/2020 No Yes L97.512 Non-pressure chronic ulcer of other part of right foot with fat layer exposed 08/28/2020 No Yes  Inactive Problems Resolved Problems Electronic Signature([name]) Signed: 08/28/2020 10:27:35 AM By: Lenda Kelp PA-C Entered By: Lenda Kelp on 08/28/2020 10:27:34 -------------------------------------------------------------------------------- Progress Note Details Patient Name: Date of Service: William [name], William [name] 08/28/2020 9:00 A M Medical Record Number: [id number] Patient Account Number: [id number] Date of Birth/Sex: Treating RN: [birth year] (69 y.o. William [name] Primary Care Provider: Hoy Register Other Clinician: Referring Provider: Treating Provider/Extender: Juleen China Weeks in Treatment: 0 Subjective Chief Complaint Information obtained from Patient Right great toe ulcer History of Present Illness (HPI) 08/28/2020 upon evaluation today patient appears for initial evaluation here in the clinic concerning issues that he has been having with a wound on the right great toe. He is seen with the help of interpreter and his daughter is present during the evaluation today as well. He does have a history of diabetes mellitus type 2 for which she does take medication. He tells me that the wound has been present for a few weeks since  at least around the beginning of August. He has been placed on doxycycline for 10 days which was on 07/23/2020. On 07/24/2020 his hemoglobin A1c was last checked and that was at 10.9. With that being said the patient tells me that he has been soaking his foot in hot water and then subsequently putting a dressing over top of this just seems to be little bit of a dry dressing possibly antibiotic ointment. Fortunately there is no signs of systemic infection or even local infection in my opinion although he does seem to have some undermining likely due to the fact that he is getting pressure and friction to the wound surface causing this to remain open. Patient History Information obtained from Patient. Allergies No Known Drug Allergies Family History Diabetes - Maternal Grandparents,Paternal Grandparents,Mother,Father,Siblings, No family history of Cancer, Heart Disease, Hereditary Spherocytosis, Hypertension, Kidney Disease, Lung Disease, Seizures, Stroke, Thyroid Problems, Tuberculosis. Social History Never smoker, Marital Status - Separated, Alcohol Use - Never, Drug Use - No History, Caffeine Use - Daily. Medical History Eyes Denies history of Cataracts, Glaucoma, Optic Neuritis Ear/Nose/Mouth/Throat Denies history of Chronic sinus problems/congestion, Middle ear problems Hematologic/Lymphatic Denies history of Anemia, Hemophilia, Human Immunodeficiency Virus, Lymphedema, Sickle Cell Disease Respiratory Denies history of Aspiration, Asthma, Chronic Obstructive Pulmonary Disease (COPD), Pneumothorax, Sleep Apnea, Tuberculosis Cardiovascular Denies history of Angina, Arrhythmia, Congestive Heart Failure, Coronary Artery Disease, Deep Vein Thrombosis, Hypertension, Hypotension, Myocardial Infarction, Peripheral Arterial Disease, Peripheral Venous Disease, Phlebitis, Vasculitis Gastrointestinal Denies history of Cirrhosis , Colitis, Crohnoos, Hepatitis A, Hepatitis B, Hepatitis  C Endocrine Patient has history of Type II Diabetes Denies history of Type I Diabetes Genitourinary Denies history of End Stage Renal Disease Immunological Denies history of Lupus Erythematosus, Raynaudoos, Scleroderma Integumentary (Skin) Denies history of History of Burn Musculoskeletal Denies history of Gout, Rheumatoid Arthritis, Osteoarthritis, Osteomyelitis Neurologic Denies history of Dementia, Neuropathy, Quadriplegia, Paraplegia, Seizure Disorder Oncologic Denies history of Received Chemotherapy, Received Radiation Psychiatric Denies history of Anorexia/bulimia, Confinement Anxiety Patient is treated with Insulin, Oral Agents. Blood sugar is tested. Hospitalization/Surgery History - 30 year ago appendectomy. Medical A Surgical History Notes nd Cardiovascular hyperlipidemia Review of Systems (ROS) Constitutional Symptoms (General Health) Denies complaints or symptoms of Fatigue, Fever, Chills, Marked Weight Change. Eyes Denies complaints or symptoms of Dry Eyes, Vision Changes, Glasses / Contacts. Ear/Nose/Mouth/Throat Denies complaints or symptoms of Chronic sinus problems or rhinitis. Respiratory Denies complaints or symptoms of Chronic or frequent coughs, Shortness of Breath. Cardiovascular Denies complaints or symptoms  of Chest pain. Gastrointestinal Denies complaints or symptoms of Frequent diarrhea, Nausea, Vomiting. Endocrine Denies complaints or symptoms of Heat/cold intolerance. Genitourinary Denies complaints or symptoms of Frequent urination. Integumentary (Skin) Complains or has symptoms of Wounds - right great toe. Musculoskeletal Denies complaints or symptoms of Muscle Pain, Muscle Weakness. Neurologic Denies complaints or symptoms of Numbness/parasthesias. Psychiatric Denies complaints or symptoms of Claustrophobia, Suicidal. Objective Constitutional sitting or standing blood pressure is within target range for patient.. supine blood pressure  is within target range for patient.. pulse regular and within target range for patient.Marland Kitchen respirations regular, non-labored and within target range for patient.Marland Kitchen temperature within target range for patient.. Well-nourished and well-hydrated in no acute distress. Vitals Time Taken: 9:18 AM, Height: 60 in, Source: Stated, Weight: 140 lbs, Source: Stated, BMI: 27.3, Temperature: 98.1 F, Pulse: 78 bpm, Respiratory Rate: 18 breaths/min, Blood Pressure: 128/78 mmHg, Capillary Blood Glucose: 80 mg/dl. Eyes conjunctiva clear no eyelid edema noted. pupils equal round and reactive to light and accommodation. Ears, Nose, Mouth, and Throat no gross abnormality of ear auricles or external auditory canals. normal hearing noted during conversation. mucus membranes moist. Respiratory normal breathing without difficulty. Cardiovascular 2+ dorsalis pedis/posterior tibialis pulses. no clubbing, cyanosis, significant edema, Musculoskeletal normal gait and posture. no significant deformity or arthritic changes, no loss or range of motion, no clubbing. Psychiatric this patient is able to make decisions and demonstrates good insight into disease process. Alert and Oriented x 3. pleasant and cooperative. General Notes: Upon inspection patient'[name] wound bed actually appeared to be doing fairly well there was minimal slough noted that did require some sharp debridement. With that being said there did not appear to be any significant need for aggressive debridement otherwise and I did remove mainly some callus around the edges of the wound which the patient tolerated without complication. Post debridement wound did appear to be doing much better. Integumentary (Hair, Skin) Wound #1 status is Open. Original cause of wound was Trauma. The wound is located on the Right T Great. The wound measures 1cm length x 0.8cm width x oe 0.2cm depth; 0.628cm^2 area and 0.126cm^3 volume. There is Fat Layer (Subcutaneous Tissue) exposed.  There is no tunneling noted, however, there is undermining starting at 12:00 and ending at 5:00 with a maximum distance of 0.3cm. There is a medium amount of serosanguineous drainage noted. The wound margin is distinct with the outline attached to the wound base. There is large (67-100%) red granulation within the wound bed. There is a small (1-33%) amount of necrotic tissue within the wound bed including Adherent Slough. Assessment Active Problems ICD-10 Type 2 diabetes mellitus with foot ulcer Non-pressure chronic ulcer of other part of right foot with fat layer exposed Procedures Wound #1 Pre-procedure diagnosis of Wound #1 is a Diabetic Wound/Ulcer of the Lower Extremity located on the Right T Great .Severity of Tissue Pre Debridement is: oe Fat layer exposed. There was a Excisional Skin/Subcutaneous Tissue Debridement with a total area of 0.8 sq cm performed by Lenda Kelp, PA. With the following instrument([name]): Curette to remove Viable and Non-Viable tissue/material. Material removed includes Callus, Subcutaneous Tissue, Slough, and Skin: Epidermis after achieving pain control using Lidocaine 5% topical ointment. No specimens were taken. A time out was conducted at 10:15, prior to the start of the procedure. A Minimum amount of bleeding was controlled with Pressure. The procedure was tolerated well with a pain level of 2 throughout and a pain level of 1 following the procedure. Post Debridement Measurements: 1cm  length x 0.8cm width x 0.2cm depth; 0.126cm^3 volume. Character of Wound/Ulcer Post Debridement is improved. Severity of Tissue Post Debridement is: Fat layer exposed. Post procedure Diagnosis Wound #1: Same as Pre-Procedure Plan Follow-up Appointments: Return Appointment in 1 week. Dressing Change Frequency: Wound #1 Right T Great: oe Change Dressing every other day. Wound Cleansing: Wound #1 Right T Great: oe May shower and wash wound with soap and water. - wash right  foot and toe with Dial antibacterial soap after shower Primary Wound Dressing: Wound #1 Right T Great: oe Calcium Alginate with Silver Secondary Dressing: Wound #1 Right T Great: oe Foam - foam donut Kerlix/Rolled Gauze Dry Gauze Off-Loading: Open toe surgical shoe to: - right foot 1. I am going to suggest at this time that we actually initiate treatment with a silver alginate dressing to the toe because of the amount of drainage this is likely to cause at this point following debridement especially. 2. I am also can recommend a foam donut around in order to try to offload. 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 3. I am also can I suggest that he can use the open toe surgical shoe whenever he is not working which will also be okay. We will see patient back for reevaluation in 1 week here in the clinic. If anything worsens or changes patient will contact our office for additional recommendations. Electronic Signature([name]) Signed: 08/28/2020 5:05:19 PM By: Lenda Kelp PA-C Entered By: Lenda Kelp on 08/28/2020 17:05:18 -------------------------------------------------------------------------------- HxROS Details Patient Name: Date of Service: William [name], William [name] 08/28/2020 9:00 A M Medical Record Number: [id number] Patient Account Number: [id number] Date of Birth/Sex: Treating RN: [birth year] (68 y.o. Tammy Sours Primary Care Provider: Hoy Register Other Clinician: Referring Provider: Treating Provider/Extender: Shana Chute, Enobong Weeks in Treatment: 0 Information Obtained From Patient Constitutional Symptoms (General Health) Complaints and Symptoms: Negative for: Fatigue; Fever; Chills; Marked Weight Change Eyes Complaints and Symptoms: Negative for: Dry Eyes; Vision Changes; Glasses / Contacts Medical History: Negative for:  Cataracts; Glaucoma; Optic Neuritis Ear/Nose/Mouth/Throat Complaints and Symptoms: Negative for: Chronic sinus problems or rhinitis Medical History: Negative for: Chronic sinus problems/congestion; Middle ear problems Respiratory Complaints and Symptoms: Negative for: Chronic or frequent coughs; Shortness of Breath Medical History: Negative for: Aspiration; Asthma; Chronic Obstructive Pulmonary Disease (COPD); Pneumothorax; Sleep Apnea; Tuberculosis Cardiovascular Complaints and Symptoms: Negative for: Chest pain Medical History: Negative for: Angina; Arrhythmia; Congestive Heart Failure; Coronary Artery Disease; Deep Vein Thrombosis; Hypertension; Hypotension; Myocardial Infarction; Peripheral Arterial Disease; Peripheral Venous Disease; Phlebitis; Vasculitis Past Medical History Notes: hyperlipidemia Gastrointestinal Complaints and Symptoms: Negative for: Frequent diarrhea; Nausea; Vomiting Medical History: Negative for: Cirrhosis ; Colitis; Crohns; Hepatitis A; Hepatitis B; Hepatitis C Endocrine Complaints and Symptoms: Negative for: Heat/cold intolerance Medical History: Positive for: Type II Diabetes Negative for: Type I Diabetes Time with diabetes: 15 years ago Treated with: Insulin, Oral agents Blood sugar tested every day: Yes Tested : daily Genitourinary Complaints and Symptoms: Negative for: Frequent urination Medical History: Negative for: End Stage Renal Disease Integumentary (Skin) Complaints and Symptoms: Positive for: Wounds - right great toe Medical History: Negative for: History of Burn Musculoskeletal Complaints and Symptoms: Negative for: Muscle Pain; Muscle Weakness Medical History: Negative for: Gout; Rheumatoid Arthritis; Osteoarthritis; Osteomyelitis Neurologic Complaints and Symptoms: Negative for: Numbness/parasthesias Medical History: Negative for: Dementia; Neuropathy; Quadriplegia;  Paraplegia; Seizure Disorder Psychiatric Complaints  and Symptoms: Negative for: Claustrophobia; Suicidal Medical History: Negative for: Anorexia/bulimia; Confinement Anxiety Hematologic/Lymphatic Medical History: Negative for: Anemia; Hemophilia; Human Immunodeficiency Virus; Lymphedema; Sickle Cell Disease Immunological Medical History: Negative for: Lupus Erythematosus; Raynauds; Scleroderma Oncologic Medical History: Negative for: Received Chemotherapy; Received Radiation Immunizations Pneumococcal Vaccine: Received Pneumococcal Vaccination: Yes Implantable Devices None Hospitalization / Surgery History Type of Hospitalization/Surgery 30 year ago appendectomy Family and Social History Cancer: No; Diabetes: Yes - Maternal Grandparents,Paternal Grandparents,Mother,Father,Siblings; Heart Disease: No; Hereditary Spherocytosis: No; Hypertension: No; Kidney Disease: No; Lung Disease: No; Seizures: No; Stroke: No; Thyroid Problems: No; Tuberculosis: No; Never smoker; Marital Status - Separated; Alcohol Use: Never; Drug Use: No History; Caffeine Use: Daily; Financial Concerns: No; Food, Clothing or Shelter Needs: No; Support System Lacking: No; Transportation Concerns: No Electronic Signature([name]) Signed: 08/28/2020 5:01:59 PM By: Shawn Stalleaton, Bobbi Signed: 08/28/2020 5:16:27 PM By: Lenda KelpStone III, Ever Halberg PA-C Entered By: Shawn Stalleaton, Bobbi on 08/28/2020 13:24:4009:28:22 -------------------------------------------------------------------------------- SuperBill Details Patient Name: Date of Service: William ChangV ILLA RES, William [name] 08/28/2020 Medical Record Number: [id number] Patient Account Number: [id number] Date of Birth/Sex: Treating RN: 1951-10-09 (69 y.o. William SchoonerM) Boehlein, Linda Primary Care Provider: Hoy RegisterNewlin, Enobong Other Clinician: Referring Provider: Treating Provider/Extender: Shana ChuteStone III, Emberleigh Reily Newlin, Enobong Weeks in Treatment: 0 Diagnosis Coding ICD-10 Codes Code Description E11.621 Type 2 diabetes mellitus with foot ulcer L97.512 Non-pressure chronic ulcer of  other part of right foot with fat layer exposed Facility Procedures CPT4 Code: [id number] Description: 99213 - WOUND CARE VISIT-LEV 3 EST PT Modifier: 25 Quantity: 1 CPT4 Code: [id number] Description: 11042 - DEB SUBQ TISSUE 20 SQ CM/< ICD-10 Diagnosis Description L97.512 Non-pressure chronic ulcer of other part of right foot with fat layer exposed Modifier: Quantity: 1 Physician Procedures : CPT4 Code Description Modifier [id number] WC PHYS LEVEL 3 NEW PT 25 ICD-10 Diagnosis Description E11.621 Type 2 diabetes mellitus with foot ulcer L97.512 Non-pressure chronic ulcer of other part of right foot with fat layer exposed Quantity: 1 : [id number] 11042 - WC PHYS SUBQ TISS 20 SQ CM ICD-10 Diagnosis Description L97.512 Non-pressure chronic ulcer of other part of right foot with fat layer exposed Quantity: 1 Electronic Signature([name]) Signed: 08/28/2020 5:05:46 PM By: Lenda KelpStone III, Jaleeyah Munce PA-C Entered By: Lenda KelpStone III, Rihaan Barrack on 08/28/2020 17:05:45

## 2020-08-28 NOTE — Progress Notes (Signed)
William, [name] ([id number]) Visit Report for 08/28/2020 Abuse/Suicide Risk Screen Details Patient Name: Date of Service: William [name], [place] NTO S 08/28/2020 9:00 A M Medical Record Number: [id number] Patient Account Number: [id number] Date of Birth/Sex: Treating RN: 1951-01-07 (69 y.o. Tammy Sours Primary Care Doni Widmer: Hoy Register Other Clinician: Referring Rebecah Dangerfield: Treating Derian Dimalanta/Extender: Shana Chute, Enobong Weeks in Treatment: 0 Abuse/Suicide Risk Screen Items Answer ABUSE RISK SCREEN: Has anyone close to you tried to hurt or harm you recentlyo No Do you feel uncomfortable with anyone in your familyo No Has anyone forced you do things that you didnt want to doo No Electronic Signature(s) Signed: 08/28/2020 5:01:59 PM By: Shawn Stall Entered By: Shawn Stall on 08/28/2020 09:19:19 -------------------------------------------------------------------------------- Activities of Daily Living Details Patient Name: Date of ServiceLeanne [name], [place] NTO S 08/28/2020 9:00 A M Medical Record Number: [id number] Patient Account Number: [id number] Date of Birth/Sex: Treating RN: 1951/11/20 (69 y.o. Tammy Sours Primary Care Farha Dano: Hoy Register Other Clinician: Referring Cythnia Osmun: Treating Sabriah Hobbins/Extender: Shana Chute, Enobong Weeks in Treatment: 0 Activities of Daily Living Items Answer Activities of Daily Living (Please select one for each item) Drive Automobile Completely Able T Medications ake Completely Able Use T elephone Completely Able Care for Appearance Completely Able Use T oilet Completely Able Bath / Shower Completely Able Dress Self Completely Able Feed Self Completely Able Walk Completely Able Get In / Out Bed Completely Able Housework Completely Able Prepare Meals Completely Able Handle Money Completely Able Shop for Self Completely Able Electronic Signature(s) Signed: 08/28/2020 5:01:59 PM By: Shawn Stall Entered By:  Shawn Stall on 08/28/2020 09:19:50 -------------------------------------------------------------------------------- Education Screening Details Patient Name: Date of Service: William [name], SA NTO S 08/28/2020 9:00 A M Medical Record Number: [id number] Patient Account Number: [id number] Date of Birth/Sex: Treating RN: February 19, 1951 (69 y.o. Tammy Sours Primary Care Johnny Gorter: Hoy Register Other Clinician: Referring Leshawn Straka: Treating Colby Catanese/Extender: Shana Chute, Enobong Weeks in Treatment: 0 Primary Learner Assessed: Patient Learning Preferences/Education Level/Primary Language Learning Preference: Explanation, Demonstration, Printed Material Highest Education Level: High School Cognitive Barrier Language Barrier: Yes Spanish interpreter needed Translator Needed: Yes Contract Interpreting Service Memory Deficit: No Emotional Barrier: No Cultural/Religious Beliefs Affecting Medical Care: No Physical Barrier Impaired Vision: No Impaired Hearing: No Decreased Hand dexterity: No Knowledge/Comprehension Knowledge Level: High Comprehension Level: High Ability to understand written instructions: High Ability to understand verbal instructions: High Motivation Anxiety Level: Calm Cooperation: Cooperative Education Importance: Acknowledges Need Interest in Health Problems: Asks Questions Perception: Coherent Willingness to Engage in Self-Management High Activities: Readiness to Engage in Self-Management High Activities: Electronic Signature(s) Signed: 08/28/2020 5:01:59 PM By: Shawn Stall Entered By: Shawn Stall on 08/28/2020 09:20:55 -------------------------------------------------------------------------------- Fall Risk Assessment Details Patient Name: Date of Service: William [name], SA NTO S 08/28/2020 9:00 A M Medical Record Number: [id number] Patient Account Number: [id number] Date of Birth/Sex: Treating RN: 11-Apr-1951 (68 y.o. Tammy Sours Primary Care Dalisha Shively: Hoy Register Other Clinician: Referring Juwaun Inskeep: Treating Janisa Labus/Extender: Shana Chute, Enobong Weeks in Treatment: 0 Fall Risk Assessment Items Have you had 2 or more falls in the last 12 monthso 0 No Have you had any fall that resulted in injury in the last 12 monthso 0 No FALLS RISK SCREEN History of falling - immediate or within 3 months 0 No Secondary diagnosis (Do you have 2 or more medical diagnoseso) 0 No Ambulatory aid None/bed rest/wheelchair/nurse 0 Yes Crutches/cane/walker 0 No Furniture 0 No Intravenous therapy Access/Saline/Heparin Lock 0  No Gait/Transferring Normal/ bed rest/ wheelchair 0 Yes Weak (short steps with or without shuffle, stooped but able to lift head while walking, may seek 0 No support from furniture) Impaired (short steps with shuffle, may have difficulty arising from chair, head down, impaired 0 No balance) Mental Status Oriented to own ability 0 Yes Electronic Signature(s) Signed: 08/28/2020 5:01:59 PM By: Shawn Stall Entered By: Shawn Stall on 08/28/2020 09:21:04 -------------------------------------------------------------------------------- Foot Assessment Details Patient Name: Date of Service: William [name], SA NTO S 08/28/2020 9:00 A M Medical Record Number: [id number] Patient Account Number: [id number] Date of Birth/Sex: Treating RN: Nov 14, 1951 (69 y.o. Tammy Sours Primary Care Yaman Grauberger: Hoy Register Other Clinician: Referring Devina Bezold: Treating Jose Corvin/Extender: Shana Chute, Enobong Weeks in Treatment: 0 Foot Assessment Items Site Locations + = Sensation present, - = Sensation absent, C = Callus, U = Ulcer R = Redness, W = Warmth, M = Maceration, PU = Pre-ulcerative lesion F = Fissure, S = Swelling, D = Dryness Assessment Right: Left: Other Deformity: No No Prior Foot Ulcer: No No Prior Amputation: No No Charcot Joint: No No Ambulatory Status: Ambulatory  Without Help Gait: Steady Electronic Signature(s) Signed: 08/28/2020 5:01:59 PM By: Shawn Stall Entered By: Shawn Stall on 08/28/2020 09:33:21 -------------------------------------------------------------------------------- Nutrition Risk Screening Details Patient Name: Date of ServiceLeanne [name], SA NTO S 08/28/2020 9:00 A M Medical Record Number: [id number] Patient Account Number: [id number] Date of Birth/Sex: Treating RN: Jan 09, 1951 (69 y.o. Tammy Sours Primary Care Sua Spadafora: Hoy Register Other Clinician: Referring Tron Flythe: Treating Taje Littler/Extender: Shana Chute, Enobong Weeks in Treatment: 0 Height (in): 60 Weight (lbs): 140 Body Mass Index (BMI): 27.3 Nutrition Risk Screening Items Score Screening NUTRITION RISK SCREEN: I have an illness or condition that made me change the kind and/or amount of food I eat 2 Yes I eat fewer than two meals per day 0 No I eat few fruits and vegetables, or milk products 0 No I have three or more drinks of beer, liquor or wine almost every day 0 No I have tooth or mouth problems that make it hard for me to eat 0 No I don't always have enough money to buy the food I need 0 No I eat alone most of the time 0 No I take three or more different prescribed or over-the-counter drugs a day 1 Yes Without wanting to, I have lost or gained 10 pounds in the last six months 0 No I am not always physically able to shop, cook and/or feed myself 0 No Nutrition Protocols Good Risk Protocol Provide education on elevated blood Moderate Risk Protocol 0 sugars and impact on wound healing, as applicable High Risk Proctocol Risk Level: Moderate Risk Score: 3 Electronic Signature(s) Signed: 08/28/2020 5:01:59 PM By: Shawn Stall Entered By: Shawn Stall on 08/28/2020 09:21:12

## 2020-08-28 NOTE — Progress Notes (Signed)
William [name] ([id number]) Visit Report for 08/28/2020 Allergy List Details Patient Name: Date of Service: William [name], [place] NTO S 08/28/2020 9:00 A M Medical Record Number: [id number] Patient Account Number: [id number] Date of Birth/Sex: Treating RN: 1951-04-05 (69 y.o. William [name] Primary Care William [name]: William [name] Other Clinician: Referring William [name]: Treating William [name]/Extender: William [name], William [name]: 0 Allergies Active Allergies No Known Drug Allergies Allergy Notes Electronic Signature(s) Signed: 08/28/2020 5:01:59 PM By: William [name] Entered By: William [name] on 08/28/2020 09:19:07 -------------------------------------------------------------------------------- Arrival Information Details Patient Name: Date of Service: William [name], SA NTO S 08/28/2020 9:00 A M Medical Record Number: [id number] Patient Account Number: [id number] Date of Birth/Sex: Treating RN: 12-28-1950 (69 y.o. William [name] Primary Care William [name]: William [name] Other Clinician: Referring William [name]: Treating William [name]/Extender: William [name], William [name]: 0 Visit Information Patient Arrived: Ambulatory Arrival Time: 09:17 Accompanied By: daughter Transfer Assistance: None Patient Identification Verified: Yes Secondary Verification Process Completed: Yes Patient Requires Transmission-Based Precautions: No Patient Has Alerts: No Electronic Signature(s) Signed: 08/28/2020 5:01:59 PM By: William [name] Entered By: William [name] on 08/28/2020 09:18:26 -------------------------------------------------------------------------------- Clinic Level of Care Assessment Details Patient Name: Date of Service: William [name], SA NTO S 08/28/2020 9:00 A M Medical Record Number: [id number] Patient Account Number: [id number] Date of Birth/Sex: Treating RN: [birth year] (69 y.o. William [name] Primary Care Darol Cush: William [name] Other Clinician: Referring  William [name]: Treating William [name]/Extender: William [name], William [name]: 0 Clinic Level of Care Assessment Items TOOL 1 Quantity Score []  - 0 Use when EandM and Procedure is performed on INITIAL visit ASSESSMENTS - Nursing Assessment / Reassessment X- 1 20 General Physical Exam (combine w/ comprehensive assessment (listed just below) when performed on new pt. evals) X- 1 25 Comprehensive Assessment (HX, ROS, Risk Assessments, Wounds Hx, etc.) ASSESSMENTS - Wound and Skin Assessment / Reassessment []  - 0 Dermatologic / Skin Assessment (not related to wound area) ASSESSMENTS - Ostomy and/or Continence Assessment and Care []  - 0 Incontinence Assessment and Management []  - 0 Ostomy Care Assessment and Management (repouching, etc.) PROCESS - Coordination of Care X - Simple Patient / Family Education for ongoing care 1 15 []  - 0 Complex (extensive) Patient / Family Education for ongoing care X- 1 10 Staff obtains , Records, T Results / Process Orders est []  - 0 Staff telephones HHA, Nursing Homes / Clarify orders / etc []  - 0 Routine Transfer to another Facility (non-emergent condition) []  - 0 Routine Hospital Admission (non-emergent condition) X- 1 15 New Admissions / / Ordering NPWT Apligraf, etc. , []  - 0 Emergency Hospital Admission (emergent condition) PROCESS - Special Needs []  - 0 Pediatric / Minor Patient Management []  - 0 Isolation Patient Management []  - 0 Hearing / Language / Visual special needs []  - 0 Assessment of Community assistance (transportation, D/C planning, etc.) []  - 0 Additional assistance / Altered mentation []  - 0 Support Surface(s) Assessment (bed, cushion, seat, etc.) INTERVENTIONS - Miscellaneous []  - 0 External ear exam []  - 0 Patient Transfer (multiple staff / / Similar devices) []  - 0 Simple Staple / Suture removal (25 or less) []  - 0 Complex Staple / Suture removal (26  or more) []  - 0 Hypo/Hyperglycemic Management (do not check if billed separately) X- 1 15 Ankle / Brachial Index (ABI) - do not check if billed separately Has the patient been seen at the hospital within the last three years: Yes Total  Score: 100 Level Of Care: New/Established - Level 3 Electronic Signature(s) Signed: 08/28/2020 5:11:27 PM By: William [name], William [name] Entered By: William [name], William on 08/28/2020 10:18:50 -------------------------------------------------------------------------------- Encounter Discharge Information Details Patient Name: Date of Service: William AppleV ILLA [name], SA NTO S 08/28/2020 9:00 A M Medical Record Number: [id number] Patient Account Number: [id number] Date of Birth/Sex: Treating RN: 1950-12-21 (69 y.o. William SoursM) [name], William Primary Care William [name]: William RegisterNewlin, William Other Clinician: Referring William [name]: Treating Marjoria Mancillas/Extender: William ChuteStone III, William [name], William [name]: 0 Encounter Discharge Information Items Post Procedure Vitals Discharge Condition: Stable Temperature (F): 98.1 Ambulatory Status: Ambulatory Pulse (bpm): 78 Discharge Destination: Home Respiratory Rate (breaths/min): 18 Transportation: Private Auto Blood Pressure (mmHg): 128/78 Accompanied By: daughter Schedule Follow-up Appointment: Yes Clinical Summary of Care: Electronic Signature(s) Signed: 08/28/2020 5:01:59 PM By: William Stalleaton, William Entered By: William Stalleaton, William on 08/28/2020 10:41:35 -------------------------------------------------------------------------------- Lower Extremity Assessment Details Patient Name: Date of Service: William [name], SA NTO S 08/28/2020 9:00 A M Medical Record Number: [id number] Patient Account Number: [id number] Date of Birth/Sex: Treating RN: 1950-12-21 (69 y.o. William SoursM) [name], William Primary Care Shakirra Buehler: William RegisterNewlin, William Other Clinician: Referring Grantham Hippert: Treating William [name]/Extender: William ChuteStone III, William [name], William [name]: 0 Edema  Assessment Assessed: [Left: No] [Right: Yes] Edema: [Left: N] [Right: o] Calf Left: Right: Point of Measurement: 26 cm From Medial Instep cm 33.5 cm Ankle Left: Right: Point of Measurement: 9 cm From Medial Instep cm 21 cm Vascular Assessment Pulses: Dorsalis Pedis Palpable: [Right:Yes] Doppler Audible: [Right:Yes] Posterior Tibial Palpable: [Right:Yes] Doppler Audible: [Right:Yes] Blood Pressure: Brachial: [Right:128] Ankle: [Right:Dorsalis Pedis: 140] Ankle Brachial Index: [Right:1.09] Electronic Signature(s) Signed: 08/28/2020 5:01:59 PM By: William Stalleaton, William Entered By: William Stalleaton, William on 08/28/2020 09:29:05 -------------------------------------------------------------------------------- Multi-Disciplinary Care Plan Details Patient Name: Date of Service: William AppleV ILLA [name], SA NTO S 08/28/2020 9:00 A M Medical Record Number: [id number] Patient Account Number: [id number] Date of Birth/Sex: Treating RN: 1950-12-21 (69 y.o. William SchoonerM) Boehlein, William Primary Care Jori Thrall: William RegisterNewlin, William Other Clinician: Referring Caroll Cunnington: Treating Kimani Hovis/Extender: William ChuteStone III, William [name], William [name]: 0 Active Inactive Nutrition Nursing Diagnoses: Impaired glucose control: actual or potential Potential for alteratiion in Nutrition/Potential for imbalanced nutrition Goals: Patient/caregiver will maintain therapeutic glucose control Date Initiated: 08/28/2020 Target Resolution Date: 09/25/2020 Goal Status: Active Interventions: Assess HgA1c results as ordered upon admission and as needed Assess patient nutrition upon admission and as needed per policy Provide education on elevated blood sugars and impact on wound healing [name] Activities: Patient referred to Primary Care Physician for further nutritional evaluation : 08/28/2020 Notes: Wound/Skin Impairment Nursing Diagnoses: Impaired tissue integrity Knowledge deficit related to ulceration/compromised skin  integrity Goals: Patient/caregiver will verbalize understanding of skin care regimen Date Initiated: 08/28/2020 Target Resolution Date: 09/25/2020 Goal Status: Active Ulcer/skin breakdown will have a volume reduction of 30% by week 4 Date Initiated: 08/28/2020 Target Resolution Date: 09/25/2020 Goal Status: Active Interventions: Assess patient/caregiver ability to obtain necessary supplies Assess patient/caregiver ability to perform ulcer/skin care regimen upon admission and as needed Assess ulceration(s) every visit Provide education on ulcer and skin care [name] Activities: Skin care regimen initiated : 08/28/2020 Topical wound management initiated : 08/28/2020 Notes: Electronic Signature(s) Signed: 08/28/2020 5:11:27 PM By: William [name], William [name] Entered By: William [name], William on 08/28/2020 10:16:45 -------------------------------------------------------------------------------- Pain Assessment Details Patient Name: Date of Service: William [name], SA NTO S 08/28/2020 9:00 A M Medical Record Number: [id number] Patient Account Number: [id number] Date of Birth/Sex: Treating RN: 1950-12-21 (69 y.o. William SoursM) [name], William Primary Care Lorenz Donley: William RegisterNewlin, William Other Clinician: Referring  Ralpheal Zappone: Treating Preeya Cleckley/Extender: William [name], William [name]: 0 Active Problems Location of Pain Severity and Description of Pain Patient Has Paino Yes Site Locations Pain Location: Pain in Ulcers Rate the pain. Current Pain Level: 1 Worst Pain Level: 10 Least Pain Level: 0 Tolerable Pain Level: 8 Pain Management and Medication Current Pain Management: Medication: No Cold Application: No Rest: No Massage: No Activity: No T.E.N.S.: No Heat Application: No Leg drop or elevation: No Is the Current Pain Management Adequate: Adequate How does your wound impact your activities of daily livingo Sleep: No Bathing: No Appetite: No Relationship With Others: No Bladder  Continence: No Emotions: No Bowel Continence: No Work: No Toileting: No Drive: No Dressing: No Hobbies: No Electronic Signature(s) Signed: 08/28/2020 5:01:59 PM By: William [name] Entered By: William [name] on 08/28/2020 09:21:57 -------------------------------------------------------------------------------- Patient/Caregiver Education Details Patient Name: Date of Service: William [name], SA NTO S 9/22/2021andnbsp9:00 A M Medical Record Number: [id number] Patient Account Number: [id number] Date of Birth/Gender: Treating RN: Apr 25, 1951 (69 y.o. William [name] Primary Care Physician: William [name] Other Clinician: Referring Physician: Treating Physician/Extender: Juleen China [name] in [name]: 0 Education Assessment Education Provided To: Patient Education Topics Provided Elevated Blood Sugar/ Impact on Healing: Handouts: Elevated Blood Sugars: How Do they Affect Wound Healingo Spanish Methods: Explain/Verbal, Printed Responses: Reinforcements needed, State content correctly Welcome [facility]: o Handouts: Welcome [facility] Spanish o Methods: Explain/Verbal, Printed Responses: Reinforcements needed, State content correctly Wound/Skin Impairment: Handouts: Caring for Your Ulcer Spanish, Skin Care Do's and Dont's Spanish Methods: Explain/Verbal, Printed Responses: Reinforcements needed, State content correctly Electronic Signature(s) Signed: 08/28/2020 5:11:27 PM By: William Deed RN, [name] Entered By: William Deed on 08/28/2020 10:17:52 -------------------------------------------------------------------------------- Wound Assessment Details Patient Name: Date of Service: William [name], SA NTO S 08/28/2020 9:00 A M Medical Record Number: [id number] Patient Account Number: [id number] Date of Birth/Sex: Treating RN: Mar 17, 1951 (69 y.o. William [name] Primary Care Malyiah Fellows: William [name] Other Clinician: Referring  Deshundra Waller: Treating Xaiden Fleig/Extender: William [name], William [name]: 0 Wound Status Wound Number: 1 Primary Etiology: Diabetic Wound/Ulcer of the Lower Extremity Wound Location: Right T Great oe Wound Status: Open Wounding Event: Trauma Comorbid History: Type II Diabetes Date Acquired: 07/09/2020 [name] Of [name]: 0 Clustered Wound: No Wound Measurements Length: (cm) 1 Width: (cm) 0.8 Depth: (cm) 0.2 Area: (cm) 0.628 Volume: (cm) 0.126 % Reduction in Area: % Reduction in Volume: Epithelialization: None Tunneling: No Undermining: Yes Starting Position (o'clock): 12 Ending Position (o'clock): 5 Maximum Distance: (cm) 0.3 Wound Description Classification: Grade 2 Wound Margin: Distinct, outline attached Exudate Amount: Medium Exudate Type: Serosanguineous Exudate Color: red, brown Wound Bed Granulation Amount: Large (67-100%) Granulation Quality: Red Necrotic Amount: Small (1-33%) Necrotic Quality: Adherent Slough Foul Odor After Cleansing: No Slough/Fibrino Yes Exposed Structure Fascia Exposed: No Fat Layer (Subcutaneous Tissue) Exposed: Yes Tendon Exposed: No Muscle Exposed: No Joint Exposed: No Bone Exposed: No [name] Notes Wound #1 (Right Toe Great) 1. Cleanse With Wound Cleanser 3. Primary Dressing Applied Calcium Alginate Ag 4. Secondary Dressing Dry Gauze Roll Gauze Foam 5. Secured With Medipore tape 7. Footwear/Offloading device applied Felt/Foam Surgical shoe Notes explained the dressing, when to return to wound center, and how often to change dressing. patient in agreement. Electronic Signature(s) Signed: 08/28/2020 5:01:59 PM By: William [name] Entered By: William [name] on 08/28/2020 09:32:15 -------------------------------------------------------------------------------- Vitals Details Patient Name: Date of Service: William [name], SA NTO S 08/28/2020 9:00 A  M Medical Record Number: [id number] Patient Account Number:  [id number] Date of Birth/Sex: Treating RN: Nov 15, 1951 (69 y.o. William [name] Primary Care Ulyssa Walthour: William [name] Other Clinician: Referring Deforrest Bogle: Treating Jalon Squier/Extender: William [name], William [name]: 0 Vital Signs Time Taken: 09:18 Temperature (F): 98.1 Height (in): 60 Pulse (bpm): 78 Source: Stated Respiratory Rate (breaths/min): 18 Weight (lbs): 140 Blood Pressure (mmHg): 128/78 Source: Stated Capillary Blood Glucose (mg/dl): 80 Body Mass Index (BMI): 27.3 Reference Range: 80 - 120 mg / dl Electronic Signature(s) Signed: 08/28/2020 5:01:59 PM By: William [name] Entered By: William [name] on 08/28/2020 09:19:04

## 2020-09-04 ENCOUNTER — Encounter (HOSPITAL_BASED_OUTPATIENT_CLINIC_OR_DEPARTMENT_OTHER): Payer: Self-pay | Admitting: Physician Assistant

## 2020-09-04 NOTE — Progress Notes (Addendum)
William, [name] ([id number]) Visit Report for 09/04/2020 Chief Complaint Document Details Patient Name: Date of Service: William [name], [place] NTO [name] 09/04/2020 9:00 A M Medical Record Number: [id number] Patient Account Number: [id number] Date of Birth/Sex: Treating RN: 20-Sep-1951 (69 y.o. Damaris Schooner Primary Care Provider: Hoy Register Other Clinician: Referring Provider: Treating Provider/Extender: Shana Chute, Enobong Weeks in Treatment: 1 Information Obtained from: Patient Chief Complaint Right great toe ulcer Electronic Signature([name]) Signed: 09/04/2020 9:04:32 AM By: Lenda Kelp PA-C Entered By: Lenda Kelp on 09/04/2020 09:04:31 -------------------------------------------------------------------------------- Debridement Details Patient Name: Date of Service: William [name], William [name] 09/04/2020 9:00 A M Medical Record Number: [id number] Patient Account Number: [id number] Date of Birth/Sex: Treating RN: 13-Jun-1951 (69 y.o. Damaris Schooner Primary Care Provider: Hoy Register Other Clinician: Referring Provider: Treating Provider/Extender: Juleen China Weeks in Treatment: 1 Debridement Performed for Assessment: Wound #1 Right T Great oe Performed By: Physician Lenda Kelp, PA Debridement Type: Debridement Severity of Tissue Pre Debridement: Fat layer exposed Level of Consciousness (Pre-procedure): Awake and Alert Pre-procedure Verification/Time Out Yes - 09:40 Taken: Start Time: 09:42 Pain Control: Lidocaine 4% T opical Solution T Area Debrided (L x W): otal 1.5 (cm) x 1 (cm) = 1.5 (cm) Tissue and other material debrided: Viable, Non-Viable, Callus, Slough, Subcutaneous, Skin: Epidermis, Slough Level: Skin/Subcutaneous Tissue Debridement Description: Excisional Instrument: Curette Bleeding: Minimum Hemostasis Achieved: Pressure End Time: 09:45 Procedural Pain: 0 Post Procedural Pain: 0 Response to Treatment: Procedure was  tolerated well Level of Consciousness (Post- Awake and Alert procedure): Post Debridement Measurements of Total Wound Length: (cm) 1 Width: (cm) 0.7 Depth: (cm) 0.2 Volume: (cm) 0.11 Character of Wound/Ulcer Post Debridement: Improved Severity of Tissue Post Debridement: Fat layer exposed Post Procedure Diagnosis Same as Pre-procedure Electronic Signature([name]) Signed: 09/04/2020 5:36:27 PM By: Zenaida Deed RN, BSN Signed: 09/04/2020 5:47:16 PM By: Lenda Kelp PA-C Entered By: Zenaida Deed on 09/04/2020 09:46:00 -------------------------------------------------------------------------------- HPI Details Patient Name: Date of Service: William [name], William [name] 09/04/2020 9:00 A M Medical Record Number: [id number] Patient Account Number: [id number] Date of Birth/Sex: Treating RN: 10/02/1951 (69 y.o. Damaris Schooner Primary Care Provider: Hoy Register Other Clinician: Referring Provider: Treating Provider/Extender: Shana Chute, Enobong Weeks in Treatment: 1 History of Present Illness HPI Description: 08/28/2020 upon evaluation today patient appears for initial evaluation here in the clinic concerning issues that he has been having with a wound on the right great toe. He is seen with the help of interpreter and his daughter is present during the evaluation today as well. He does have a history of diabetes mellitus type 2 for which she does take medication. He tells me that the wound has been present for a few weeks since at least around the beginning of August. He has been placed on doxycycline for 10 days which was on 07/23/2020. On 07/24/2020 his hemoglobin A1c was last checked and that was at 10.9. With that being said the patient tells me that he has been soaking his foot in hot water and then subsequently putting a dressing over top of this just seems to be little bit of a dry dressing possibly antibiotic ointment. Fortunately there is no signs of systemic infection or  even local infection in my opinion although he does seem to have some undermining likely due to the fact that he is getting pressure and friction to the wound surface causing this to remain open. 09/04/2020 upon evaluation today patient appears  to be doing very well with regard to his toe ulcer. I do feel like this is doing better in regard to the undermining and overall I think he is making good progress. Especially after 1 week. There does not appear to be any signs of active infection at this time. No fevers, chills, nausea, vomiting, or diarrhea. Electronic Signature([name]) Signed: 09/04/2020 9:48:30 AM By: Lenda Kelp PA-C Entered By: Lenda Kelp on 09/04/2020 09:48:30 -------------------------------------------------------------------------------- Physical Exam Details Patient Name: Date of Service: William [name], William [name] 09/04/2020 9:00 A M Medical Record Number: [id number] Patient Account Number: [id number] Date of Birth/Sex: Treating RN: [birth year] (69 y.o. Damaris Schooner Primary Care Provider: Hoy Register Other Clinician: Referring Provider: Treating Provider/Extender: Shana Chute, Enobong Weeks in Treatment: 1 Constitutional Well-nourished and well-hydrated in no acute distress. Respiratory normal breathing without difficulty. Psychiatric this patient is able to make decisions and demonstrates good insight into disease process. Alert and Oriented x 3. pleasant and cooperative. Notes Upon inspection patient'[name] wound bed actually showed signs of good granulation and epithelization at this point there were little undermining noted compared to last week even and I feel like this is showing signs of overall improvement. My suggestion currently is good be that we likely continue with the current measures. Electronic Signature([name]) Signed: 09/04/2020 9:49:19 AM By: Lenda Kelp PA-C Entered By: Lenda Kelp on 09/04/2020  09:49:18 -------------------------------------------------------------------------------- Physician Orders Details Patient Name: Date of Service: William [name], William [name] 09/04/2020 9:00 A M Medical Record Number: [id number] Patient Account Number: [id number] Date of Birth/Sex: Treating RN: 10-18-1951 (69 y.o. Damaris Schooner Primary Care Provider: Hoy Register Other Clinician: Referring Provider: Treating Provider/Extender: Shana Chute, Enobong Weeks in Treatment: 1 Verbal / Phone Orders: No Diagnosis Coding ICD-10 Coding Code Description E11.621 Type 2 diabetes mellitus with foot ulcer L97.512 Non-pressure chronic ulcer of other part of right foot with fat layer exposed Follow-up Appointments Return Appointment in 1 week. Dressing Change Frequency Wound #1 Right T Great oe Change Dressing every other day. Wound Cleansing Wound #1 Right T Great oe May shower and wash wound with soap and water. - wash right foot and toe with Dial antibacterial soap after shower Primary Wound Dressing Wound #1 Right T Great oe Calcium Alginate with Silver Secondary Dressing Wound #1 Right T Great oe Foam - foam donut Kerlix/Rolled Gauze Dry Gauze Off-Loading Open toe surgical shoe to: - right foot Patient Medications llergies: No Known Drug Allergies A Notifications Medication Indication Start End prior to debridement 09/04/2020 lidocaine DOSE topical 4 % cream - cream topical Electronic Signature([name]) Signed: 09/04/2020 5:36:27 PM By: Zenaida Deed RN, BSN Signed: 09/04/2020 5:47:16 PM By: Lenda Kelp PA-C Entered By: Zenaida Deed on 09/04/2020 09:47:09 -------------------------------------------------------------------------------- Problem List Details Patient Name: Date of Service: William [name], William [name] 09/04/2020 9:00 A M Medical Record Number: [id number] Patient Account Number: [id number] Date of Birth/Sex: Treating RN: 1950-12-15 (69 y.o. Damaris Schooner Primary Care Provider: Hoy Register Other Clinician: Referring Provider: Treating Provider/Extender: Shana Chute, Enobong Weeks in Treatment: 1 Active Problems ICD-10 Encounter Code Description Active Date MDM Diagnosis E11.621 Type 2 diabetes mellitus with foot ulcer 08/28/2020 No Yes L97.512 Non-pressure chronic ulcer of other part of right foot with fat layer exposed 08/28/2020 No Yes Inactive Problems Resolved Problems Electronic Signature([name]) Signed: 09/04/2020 9:04:27 AM By: Lenda Kelp PA-C Entered By: Lenda Kelp on 09/04/2020 09:04:27 -------------------------------------------------------------------------------- Progress Note Details Patient  Name: Date of Service: William [name], [place] NTO [name] 09/04/2020 9:00 A M Medical Record Number: [id number] Patient Account Number: [id number] Date of Birth/Sex: Treating RN: Mar 27, 1951 (69 y.o. Damaris Schooner Primary Care Provider: Hoy Register Other Clinician: Referring Provider: Treating Provider/Extender: Juleen China Weeks in Treatment: 1 Subjective Chief Complaint Information obtained from Patient Right great toe ulcer History of Present Illness (HPI) 08/28/2020 upon evaluation today patient appears for initial evaluation here in the clinic concerning issues that he has been having with a wound on the right great toe. He is seen with the help of interpreter and his daughter is present during the evaluation today as well. He does have a history of diabetes mellitus type 2 for which she does take medication. He tells me that the wound has been present for a few weeks since at least around the beginning of August. He has been placed on doxycycline for 10 days which was on 07/23/2020. On 07/24/2020 his hemoglobin A1c was last checked and that was at 10.9. With that being said the patient tells me that he has been soaking his foot in hot water and then subsequently putting a dressing over top  of this just seems to be little bit of a dry dressing possibly antibiotic ointment. Fortunately there is no signs of systemic infection or even local infection in my opinion although he does seem to have some undermining likely due to the fact that he is getting pressure and friction to the wound surface causing this to remain open. 09/04/2020 upon evaluation today patient appears to be doing very well with regard to his toe ulcer. I do feel like this is doing better in regard to the undermining and overall I think he is making good progress. Especially after 1 week. There does not appear to be any signs of active infection at this time. No fevers, chills, nausea, vomiting, or diarrhea. Objective Constitutional Well-nourished and well-hydrated in no acute distress. Vitals Time Taken: 9:15 AM, Height: 60 in, Weight: 140 lbs, BMI: 27.3, Temperature: 99.4 F, Pulse: 81 bpm, Respiratory Rate: 18 breaths/min, Blood Pressure: 130/80 mmHg, Capillary Blood Glucose: 160 mg/dl. General Notes: Blood glucose last checked on Monday. Respiratory normal breathing without difficulty. Psychiatric this patient is able to make decisions and demonstrates good insight into disease process. Alert and Oriented x 3. pleasant and cooperative. General Notes: Upon inspection patient'[name] wound bed actually showed signs of good granulation and epithelization at this point there were little undermining noted compared to last week even and I feel like this is showing signs of overall improvement. My suggestion currently is good be that we likely continue with the current measures. Integumentary (Hair, Skin) Wound #1 status is Open. Original cause of wound was Trauma. The wound is located on the Right T Great. The wound measures 1cm length x 0.7cm width x oe 0.2cm depth; 0.55cm^2 area and 0.11cm^3 volume. There is Fat Layer (Subcutaneous Tissue) exposed. There is no tunneling or undermining noted. There is a medium amount of  serosanguineous drainage noted. The wound margin is distinct with the outline attached to the wound base. There is large (67-100%) red granulation within the wound bed. There is no necrotic tissue within the wound bed. General Notes: thin callus noted to periwound. educated patient on limiting about pressure on toe. patient in agreement. Assessment Active Problems ICD-10 Type 2 diabetes mellitus with foot ulcer Non-pressure chronic ulcer of other part of right foot with fat layer exposed Procedures Wound #1  Pre-procedure diagnosis of Wound #1 is a Diabetic Wound/Ulcer of the Lower Extremity located on the Right T Great .Severity of Tissue Pre Debridement is: oe Fat layer exposed. There was a Excisional Skin/Subcutaneous Tissue Debridement with a total area of 1.5 sq cm performed by Lenda KelpStone III, Domnick Chervenak, PA. With the following instrument([name]): Curette to remove Viable and Non-Viable tissue/material. Material removed includes Callus, Subcutaneous Tissue, Slough, and Skin: Epidermis after achieving pain control using Lidocaine 4% Topical Solution. No specimens were taken. A time out was conducted at 09:40, prior to the start of the procedure. A Minimum amount of bleeding was controlled with Pressure. The procedure was tolerated well with a pain level of 0 throughout and a pain level of 0 following the procedure. Post Debridement Measurements: 1cm length x 0.7cm width x 0.2cm depth; 0.11cm^3 volume. Character of Wound/Ulcer Post Debridement is improved. Severity of Tissue Post Debridement is: Fat layer exposed. Post procedure Diagnosis Wound #1: Same as Pre-Procedure Plan Follow-up Appointments: Return Appointment in 1 week. Dressing Change Frequency: Wound #1 Right T Great: oe Change Dressing every other day. Wound Cleansing: Wound #1 Right T Great: oe May shower and wash wound with soap and water. - wash right foot and toe with Dial antibacterial soap after shower Primary Wound Dressing: Wound  #1 Right T Great: oe Calcium Alginate with Silver Secondary Dressing: Wound #1 Right T Great: oe Foam - foam donut Kerlix/Rolled Gauze Dry Gauze Off-Loading: Open toe surgical shoe to: - right foot The following medication([name]) was prescribed: lidocaine topical 4 % cream cream topical for prior to debridement was prescribed at facility 1. I would recommend currently that we going to continue with the wound care measures as before and the patient is in agreement with that plan. `For the time being we will get a continue the silver alginate dressing followed by foam to cover. 2. I am also can recommend at this time that we continue to monitor for any signs of active infection. I do not see any evidence of infection at this point though that obviously could change we want to keep a close eye on things here. 3. I am also can recommend the patient continue with the offloading as much as possible. Obviously he does have to work and there is only some when she continue working in [employer] he has to have wound issues. Nonetheless we will keep an eye on things. We will see patient back for reevaluation in 1 week here in the clinic. If anything worsens or changes patient will contact our office for additional recommendations. Electronic Signature([name]) Signed: 09/04/2020 9:52:03 AM By: Lenda KelpStone III, Chase Arnall PA-C Entered By: Lenda KelpStone III, Maleki Hippe on 09/04/2020 09:52:02 -------------------------------------------------------------------------------- SuperBill Details Patient Name: Date of Service: William ChangV ILLA [name], William [name] 09/04/2020 Medical Record Number: [id number] Patient Account Number: [id number] Date of Birth/Sex: Treating RN: May 01, 1951 (69 y.o. Damaris SchoonerM) Boehlein, Linda Primary Care Provider: Hoy RegisterNewlin, Enobong Other Clinician: Referring Provider: Treating Provider/Extender: Shana ChuteStone III, Kamla Skilton Newlin, Enobong Weeks in Treatment: 1 Diagnosis Coding ICD-10 Codes Code Description [phone] Type 2 diabetes mellitus with  foot ulcer L97.512 Non-pressure chronic ulcer of other part of right foot with fat layer exposed Facility Procedures CPT4 Code: [id number] Description: 11042 - DEB SUBQ TISSUE 20 SQ CM/< ICD-10 Diagnosis Description L97.512 Non-pressure chronic ulcer of other part of right foot with fat layer exposed Modifier: Quantity: 1 Physician Procedures : CPT4 Code Description Modifier [id number] 11042 - WC PHYS SUBQ TISS 20 SQ CM ICD-10 Diagnosis Description L97.512 Non-pressure chronic ulcer of  other part of right foot with fat layer exposed Quantity: 1 Electronic Signature([name]) Signed: 09/04/2020 9:52:14 AM By: Lenda Kelp PA-C Entered By: Lenda Kelp on 09/04/2020 09:52:12

## 2020-09-04 NOTE — Progress Notes (Signed)
ARIK, HUSMANN ([id number]) Visit Report for 09/04/2020 Arrival Information Details Patient Name: Date of Service: William [name], [place] NTO S 09/04/2020 9:00 A M Medical Record Number: [id number] Patient Account Number: [id number] Date of Birth/Sex: Treating RN: Jan 25, 1951 (69 y.o. Tammy Sours Primary Care Neeva Trew: Hoy Register Other Clinician: Referring Shequila Neglia: Treating Fabrizzio Marcella/Extender: Shana Chute, Enobong Weeks in Treatment: 1 Visit Information History Since Last Visit Added or deleted any medications: No Patient Arrived: Ambulatory Any new allergies or adverse reactions: No Arrival Time: 09:15 Had a fall or experienced change in No Accompanied By: interpreter activities of daily living that may affect Transfer Assistance: None risk of falls: Patient Identification Verified: Yes Signs or symptoms of abuse/neglect since No Secondary Verification Process Completed: Yes last visito Patient Requires Transmission-Based Precautions: No Hospitalized since last visit: No Patient Has Alerts: No Implantable device outside of the clinic No excluding cellular tissue based products placed in the center since last visit: Has Dressing in Place as Prescribed: Yes Has Footwear/Offloading in Place as Yes Prescribed: Right: Surgical Shoe with Pressure Relief Insole Pain Present Now: No Electronic Signature(s) Signed: 09/04/2020 5:13:43 PM By: Shawn Stall Entered By: Shawn Stall on 09/04/2020 09:22:43 -------------------------------------------------------------------------------- Encounter Discharge Information Details Patient Name: Date of Service: William [name], William [name] NTO S 09/04/2020 9:00 A M Medical Record Number: [id number] Patient Account Number: [id number] Date of Birth/Sex: Treating RN: 09-07-1951 (69 y.o. William [name] Primary Care Brailen Macneal: Hoy Register Other Clinician: Referring Johnnae Impastato: Treating River Ambrosio/Extender: Shana Chute,  Enobong Weeks in Treatment: 1 Encounter Discharge Information Items Post Procedure Vitals Discharge Condition: Stable Temperature (F): 99.4 Ambulatory Status: Ambulatory Pulse (bpm): 91 Discharge Destination: Home Respiratory Rate (breaths/min): 18 Transportation: Private Auto Blood Pressure (mmHg): 130/80 Accompanied By: interpreter Schedule Follow-up Appointment: Yes Clinical Summary of Care: Patient Declined Electronic Signature(s) Signed: 09/04/2020 5:36:27 PM By: Zenaida Deed RN, BSN Entered By: Zenaida Deed on 09/04/2020 13:08:32 -------------------------------------------------------------------------------- Lower Extremity Assessment Details Patient Name: Date of Service: William [name], William [name] NTO S 09/04/2020 9:00 A M Medical Record Number: [id number] Patient Account Number: [id number] Date of Birth/Sex: Treating RN: 10/12/1951 (69 y.o. Tammy Sours Primary Care Peyten Punches: Hoy Register Other Clinician: Referring Kaysie Michelini: Treating Mordche Hedglin/Extender: Shana Chute, Enobong Weeks in Treatment: 1 Edema Assessment Assessed: [Left: No] [Right: Yes] Edema: [Left: N] [Right: o] Calf Left: Right: Point of Measurement: 26 cm From Medial Instep 34 cm Ankle Left: Right: Point of Measurement: 9 cm From Medial Instep 21 cm Vascular Assessment Pulses: Dorsalis Pedis Palpable: [Right:Yes] Electronic Signature(s) Signed: 09/04/2020 5:13:43 PM By: Shawn Stall Entered By: Shawn Stall on 09/04/2020 09:23:25 -------------------------------------------------------------------------------- Multi-Disciplinary Care Plan Details Patient Name: Date of Service: William [name], William [name] NTO S 09/04/2020 9:00 A M Medical Record Number: [id number] Patient Account Number: [id number] Date of Birth/Sex: Treating RN: 05-Dec-1951 (69 y.o. William [name] Primary Care Bryla Burek: Hoy Register Other Clinician: Referring Maylon Sailors: Treating William [name]/Extender: Shana Chute, Enobong Weeks in Treatment: 1 Active Inactive Nutrition Nursing Diagnoses: Impaired glucose control: actual or potential Potential for alteratiion in Nutrition/Potential for imbalanced nutrition Goals: Patient/caregiver will maintain therapeutic glucose control Date Initiated: 08/28/2020 Target Resolution Date: 09/25/2020 Goal Status: Active Interventions: Assess HgA1c results as ordered upon admission and as needed Assess patient nutrition upon admission and as needed per policy Provide education on elevated blood sugars and impact on wound healing Treatment Activities: Patient referred to Primary Care Physician for further nutritional evaluation : 08/28/2020 Notes: Wound/Skin Impairment Nursing Diagnoses: Impaired tissue integrity Knowledge  deficit related to ulceration/compromised skin integrity Goals: Patient/caregiver will verbalize understanding of skin care regimen Date Initiated: 08/28/2020 Target Resolution Date: 09/25/2020 Goal Status: Active Ulcer/skin breakdown will have a volume reduction of 30% by week 4 Date Initiated: 08/28/2020 Target Resolution Date: 09/25/2020 Goal Status: Active Interventions: Assess patient/caregiver ability to obtain necessary supplies Assess patient/caregiver ability to perform ulcer/skin care regimen upon admission and as needed Assess ulceration(s) every visit Provide education on ulcer and skin care Treatment Activities: Skin care regimen initiated : 08/28/2020 Topical wound management initiated : 08/28/2020 Notes: Electronic Signature(s) Signed: 09/04/2020 5:36:27 PM By: Zenaida Deed RN, BSN Entered By: Zenaida Deed on 09/04/2020 09:39:53 -------------------------------------------------------------------------------- Pain Assessment Details Patient Name: Date of Service: William [name], William [name] NTO S 09/04/2020 9:00 A M Medical Record Number: [id number] Patient Account Number: [id number] Date of Birth/Sex: Treating  RN: 17-Sep-1951 (69 y.o. Tammy Sours Primary Care Keala Drum: Hoy Register Other Clinician: Referring Tarshia Kot: Treating Lee Kuang/Extender: Shana Chute, Enobong Weeks in Treatment: 1 Active Problems Location of Pain Severity and Description of Pain Patient Has Paino No Site Locations Rate the pain. Rate the pain. Current Pain Level: 0 Pain Management and Medication Current Pain Management: Medication: No Cold Application: No Rest: No Massage: No Activity: No T.E.N.S.: No Heat Application: No Leg drop or elevation: No Is the Current Pain Management Adequate: Adequate How does your wound impact your activities of daily livingo Sleep: No Bathing: No Appetite: No Relationship With Others: No Bladder Continence: No Emotions: No Bowel Continence: No Work: No Toileting: No Drive: No Dressing: No Hobbies: No Electronic Signature(s) Signed: 09/04/2020 5:13:43 PM By: Shawn Stall Entered By: Shawn Stall on 09/04/2020 09:23:14 -------------------------------------------------------------------------------- Patient/Caregiver Education Details Patient Name: Date of Service: William [name], William [name] NTO S 9/29/2021andnbsp9:00 A M Medical Record Number: [id number] Patient Account Number: [id number] Date of Birth/Gender: Treating RN: April 04, 1951 (69 y.o. William [name] Primary Care Physician: Hoy Register Other Clinician: Referring Physician: Treating Physician/Extender: Juleen China Weeks in Treatment: 1 Education Assessment Education Provided To: Patient Education Topics Provided Offloading: Methods: Explain/Verbal Responses: Reinforcements needed, State content correctly Wound/Skin Impairment: Methods: Explain/Verbal Responses: Reinforcements needed, State content correctly Electronic Signature(s) Signed: 09/04/2020 5:36:27 PM By: Zenaida Deed RN, BSN Signed: 09/04/2020 5:36:27 PM By: Zenaida Deed RN, BSN Entered By:  Zenaida Deed on 09/04/2020 09:40:51 -------------------------------------------------------------------------------- Wound Assessment Details Patient Name: Date of Service: William [name], William [name] NTO S 09/04/2020 9:00 A M Medical Record Number: [id number] Patient Account Number: [id number] Date of Birth/Sex: Treating RN: July 20, 1951 (69 y.o. Tammy Sours Primary Care Porcha Deblanc: Hoy Register Other Clinician: Referring Kimber Esterly: Treating Graysen Depaula/Extender: Shana Chute, Enobong Weeks in Treatment: 1 Wound Status Wound Number: 1 Primary Etiology: Diabetic Wound/Ulcer of the Lower Extremity Wound Location: Right T Great oe Wound Status: Open Wounding Event: Trauma Comorbid History: Type II Diabetes Date Acquired: 07/09/2020 Weeks Of Treatment: 1 Clustered Wound: No Wound Measurements Length: (cm) 1 Width: (cm) 0.7 Depth: (cm) 0.2 Area: (cm) 0.55 Volume: (cm) 0.11 % Reduction in Area: 12.4% % Reduction in Volume: 12.7% Epithelialization: None Tunneling: No Undermining: No Wound Description Classification: Grade 2 Wound Margin: Distinct, outline attached Exudate Amount: Medium Exudate Type: Serosanguineous Exudate Color: red, brown Foul Odor After Cleansing: No Slough/Fibrino No Wound Bed Granulation Amount: Large (67-100%) Exposed Structure Granulation Quality: Red Fascia Exposed: No Necrotic Amount: None Present (0%) Fat Layer (Subcutaneous Tissue) Exposed: Yes Tendon Exposed: No Muscle Exposed: No Joint Exposed: No Bone Exposed: No Assessment Notes thin callus noted to  periwound. educated patient on limiting about pressure on toe. patient in agreement. Treatment Notes Wound #1 (Right Toe Great) 3. Primary Dressing Applied Calcium Alginate Ag 4. Secondary Dressing Dry Gauze Roll Gauze Foam 5. Secured With Tape Notes explained the dressing, when to return to wound center, and how often to change dressing. patient in agreement. Electronic  Signature(s) Signed: 09/04/2020 5:13:43 PM By: Shawn Stall Entered By: Shawn Stall on 09/04/2020 09:24:05 -------------------------------------------------------------------------------- Vitals Details Patient Name: Date of Service: William [name], William [name] NTO S 09/04/2020 9:00 A M Medical Record Number: [id number] Patient Account Number: [id number] Date of Birth/Sex: Treating RN: Sep 12, 1951 (69 y.o. Tammy Sours Primary Care Cristal Howatt: Hoy Register Other Clinician: Referring Destenee Guerry: Treating Kenady Doxtater/Extender: Shana Chute, Enobong Weeks in Treatment: 1 Vital Signs Time Taken: 09:15 Temperature (F): 99.4 Height (in): 60 Pulse (bpm): 81 Weight (lbs): 140 Respiratory Rate (breaths/min): 18 Body Mass Index (BMI): 27.3 Blood Pressure (mmHg): 130/80 Capillary Blood Glucose (mg/dl): 270 Reference Range: 80 - 120 mg / dl Notes Blood glucose last checked on Monday. Electronic Signature(s) Signed: 09/04/2020 5:13:43 PM By: Shawn Stall Entered By: Shawn Stall on 09/04/2020 09:23:06

## 2020-09-11 ENCOUNTER — Encounter (HOSPITAL_BASED_OUTPATIENT_CLINIC_OR_DEPARTMENT_OTHER): Payer: Self-pay | Attending: Physician Assistant | Admitting: Physician Assistant

## 2020-09-11 DIAGNOSIS — L97512 Non-pressure chronic ulcer of other part of right foot with fat layer exposed: Secondary | ICD-10-CM | POA: Insufficient documentation

## 2020-09-11 DIAGNOSIS — E11621 Type 2 diabetes mellitus with foot ulcer: Secondary | ICD-10-CM | POA: Insufficient documentation

## 2020-09-11 NOTE — Progress Notes (Signed)
William [name] ([id number]) Visit Report for 09/11/2020 Arrival Information Details Patient Name: Date of Service: William [name], [place] NTO S 09/11/2020 9:15 A M Medical Record Number: [id number] Patient Account Number: [id number] Date of Birth/Sex: Treating RN: Apr 22, 1951 (69 y.o. William [name] Primary Care Dalonte Hardage: Hoy Register Other Clinician: Referring Yordy Matton: Treating Dlisa Barnwell/Extender: Juleen China Weeks in Treatment: 2 Visit Information History Since Last Visit Added or deleted any medications: No Patient Arrived: Ambulatory Any new allergies or adverse reactions: No Arrival Time: 09:16 Had a fall or experienced change in No Accompanied By: self activities of daily living that may affect Transfer Assistance: None risk of falls: Patient Identification Verified: Yes Signs or symptoms of abuse/neglect since last visito No Secondary Verification Process Completed: Yes Hospitalized since last visit: No Patient Requires Transmission-Based Precautions: No Implantable device outside of the clinic excluding No Patient Has Alerts: No cellular tissue based products placed in the center since last visit: Has Dressing in Place as Prescribed: Yes Pain Present Now: No Electronic Signature(s) Signed: 09/11/2020 10:23:51 AM By: Karl Ito Entered By: Karl Ito on 09/11/2020 09:18:17 -------------------------------------------------------------------------------- Encounter Discharge Information Details Patient Name: Date of Service: William [name], [place] NTO S 09/11/2020 9:15 A M Medical Record Number: [id number] Patient Account Number: [id number] Date of Birth/Sex: Treating RN: Apr 07, 1951 (69 y.o. William [name]) Yevonne Pax Primary Care Maya Arcand: Hoy Register Other Clinician: Referring Makina Skow: Treating Kadir Azucena/Extender: Shana Chute, Enobong Weeks in Treatment: 2 Encounter Discharge Information Items Post Procedure Vitals Discharge Condition:  Stable Temperature (F): 98.5 Ambulatory Status: Ambulatory Pulse (bpm): 83 Discharge Destination: Home Respiratory Rate (breaths/min): 18 Transportation: Private Auto Blood Pressure (mmHg): 95/58 Accompanied By: self Schedule Follow-up Appointment: Yes Clinical Summary of Care: Patient Declined Electronic Signature(s) Signed: 09/11/2020 5:53:32 PM By: Yevonne Pax RN Entered By: Yevonne Pax on 09/11/2020 09:57:02 -------------------------------------------------------------------------------- Lower Extremity Assessment Details Patient Name: Date of Service: William [name], [place] NTO S 09/11/2020 9:15 A M Medical Record Number: [id number] Patient Account Number: [id number] Date of Birth/Sex: Treating RN: 03/21/1951 (69 y.o. William [name]) Yevonne Pax Primary Care Sameer Teeple: Hoy Register Other Clinician: Referring Ellianne Gowen: Treating Key Cen/Extender: Shana Chute, Enobong Weeks in Treatment: 2 Edema Assessment Assessed: [Left: No] [Right: No] Edema: [Left: N] [Right: o] Calf Left: Right: Point of Measurement: 26 cm From Medial Instep 34 cm Ankle Left: Right: Point of Measurement: 9 cm From Medial Instep 21 cm Electronic Signature(s) Signed: 09/11/2020 5:53:32 PM By: Yevonne Pax RN Entered By: Yevonne Pax on 09/11/2020 09:27:50 -------------------------------------------------------------------------------- Multi-Disciplinary Care Plan Details Patient Name: Date of Service: William [name] RES, [place] NTO S 09/11/2020 9:15 A M Medical Record Number: [id number] Patient Account Number: [id number] Date of Birth/Sex: Treating RN: Dec 30, 1950 (69 y.o. William [name] Primary Care Tyler Robidoux: Hoy Register Other Clinician: Referring Mae Cianci: Treating Amaranta Mehl/Extender: Shana Chute, Enobong Weeks in Treatment: 2 Active Inactive Nutrition Nursing Diagnoses: Impaired glucose control: actual or potential Potential for alteratiion in Nutrition/Potential for imbalanced  nutrition Goals: Patient/caregiver will maintain therapeutic glucose control Date Initiated: 08/28/2020 Target Resolution Date: 09/25/2020 Goal Status: Active Interventions: Assess HgA1c results as ordered upon admission and as needed Assess patient nutrition upon admission and as needed per policy Provide education on elevated blood sugars and impact on wound healing Treatment Activities: Patient referred to Primary Care Physician for further nutritional evaluation : 08/28/2020 Notes: Wound/Skin Impairment Nursing Diagnoses: Impaired tissue integrity Knowledge deficit related to ulceration/compromised skin integrity Goals: Patient/caregiver will verbalize understanding of skin care regimen Date Initiated: 08/28/2020 Target Resolution Date:  09/25/2020 Goal Status: Active Ulcer/skin breakdown will have a volume reduction of 30% by week 4 Date Initiated: 08/28/2020 Target Resolution Date: 09/25/2020 Goal Status: Active Interventions: Assess patient/caregiver ability to obtain necessary supplies Assess patient/caregiver ability to perform ulcer/skin care regimen upon admission and as needed Assess ulceration(s) every visit Provide education on ulcer and skin care Treatment Activities: Skin care regimen initiated : 08/28/2020 Topical wound management initiated : 08/28/2020 Notes: Electronic Signature(s) Signed: 09/11/2020 6:14:29 PM By: Zenaida Deed RN, BSN Entered By: Zenaida Deed on 09/11/2020 09:38:50 -------------------------------------------------------------------------------- Pain Assessment Details Patient Name: Date of Service: William [name], [place] NTO S 09/11/2020 9:15 A M Medical Record Number: [id number] Patient Account Number: [id number] Date of Birth/Sex: Treating RN: 1951-09-09 (69 y.o. William [name] Primary Care Michale Weikel: Hoy Register Other Clinician: Referring Tymar Polyak: Treating Katia Hannen/Extender: Shana Chute, Enobong Weeks in Treatment:  2 Active Problems Location of Pain Severity and Description of Pain Patient Has Paino No Site Locations Pain Management and Medication Current Pain Management: Electronic Signature(s) Signed: 09/11/2020 10:23:51 AM By: Karl Ito Signed: 09/11/2020 6:14:29 PM By: Zenaida Deed RN, BSN Entered By: Karl Ito on 09/11/2020 09:18:44 -------------------------------------------------------------------------------- Patient/Caregiver Education Details Patient Name: Date of Service: William [name], [place] NTO S 10/6/2021andnbsp9:15 A M Medical Record Number: [id number] Patient Account Number: [id number] Date of Birth/Gender: Treating RN: [birth year] (69 y.o. William [name] Primary Care Physician: Hoy Register Other Clinician: Referring Physician: Treating Physician/Extender: Juleen China Weeks in Treatment: 2 Education Assessment Education Provided To: Patient Education Topics Provided Elevated Blood Sugar/ Impact on Healing: Methods: Explain/Verbal Responses: Reinforcements needed, State content correctly Offloading: Methods: Explain/Verbal Responses: Reinforcements needed, State content correctly Wound/Skin Impairment: Methods: Explain/Verbal Responses: Reinforcements needed, State content correctly Electronic Signature(s) Signed: 09/11/2020 6:14:29 PM By: Zenaida Deed RN, BSN Entered By: Zenaida Deed on 09/11/2020 09:39:16 -------------------------------------------------------------------------------- Wound Assessment Details Patient Name: Date of Service: William [name], [place] NTO S 09/11/2020 9:15 A M Medical Record Number: [id number] Patient Account Number: [id number] Date of Birth/Sex: Treating RN: January 31, 1951 (69 y.o. William [name] Primary Care Fallynn Gravett: Hoy Register Other Clinician: Referring Ercil Cassis: Treating Nechuma Boven/Extender: Shana Chute, Enobong Weeks in Treatment: 2 Wound Status Wound Number: 1 Primary  Etiology: Diabetic Wound/Ulcer of the Lower Extremity Wound Location: Right T Great oe Wound Status: Open Wounding Event: Trauma Comorbid History: Type II Diabetes Date Acquired: 07/09/2020 Weeks Of Treatment: 2 Clustered Wound: No Wound Measurements Length: (cm) 0.5 Width: (cm) 0.4 Depth: (cm) 0.2 Area: (cm) 0.157 Volume: (cm) 0.031 % Reduction in Area: 75% % Reduction in Volume: 75.4% Epithelialization: Medium (34-66%) Tunneling: No Undermining: No Wound Description Classification: Grade 2 Wound Margin: Distinct, outline attached Exudate Amount: Medium Exudate Type: Serosanguineous Exudate Color: red, brown Foul Odor After Cleansing: No Slough/Fibrino No Wound Bed Granulation Amount: Large (67-100%) Exposed Structure Granulation Quality: Red Fascia Exposed: No Necrotic Amount: None Present (0%) Fat Layer (Subcutaneous Tissue) Exposed: Yes Tendon Exposed: No Muscle Exposed: No Joint Exposed: No Bone Exposed: No Treatment Notes Wound #1 (Right Toe Great) 1. Cleanse With Wound Cleanser 3. Primary Dressing Applied Calcium Alginate Ag 4. Secondary Dressing Dry Gauze Foam 5. Secured With Tape Other (specify in notes) Notes explained the dressing, when to return to wound center, and how often to change dressing. patient in agreement. netting Electronic Signature(s) Signed: 09/11/2020 5:53:32 PM By: Yevonne Pax RN Signed: 09/11/2020 6:14:29 PM By: Zenaida Deed RN, BSN Entered By: Yevonne Pax on 09/11/2020 09:27:28 -------------------------------------------------------------------------------- Vitals Details Patient Name: Date of Service: V  ILLA RES, [place] NTO S 09/11/2020 9:15 A M Medical Record Number: [id number] Patient Account Number: [id number] Date of Birth/Sex: Treating RN: 09/21/1951 (69 y.o. William [name] Primary Care Asa Fath: Hoy Register Other Clinician: Referring Leva Baine: Treating Mardell Suttles/Extender: Shana Chute,  Enobong Weeks in Treatment: 2 Vital Signs Time Taken: 09:18 Temperature (F): 98.5 Height (in): 60 Pulse (bpm): 83 Weight (lbs): 140 Respiratory Rate (breaths/min): 18 Body Mass Index (BMI): 27.3 Blood Pressure (mmHg): 95/58 Capillary Blood Glucose (mg/dl): 193 Reference Range: 80 - 120 mg / dl Electronic Signature(s) Signed: 09/11/2020 10:23:51 AM By: Karl Ito Entered By: Karl Ito on 09/11/2020 09:18:39

## 2020-09-11 NOTE — Progress Notes (Addendum)
ALCEE, SIPOS ([id number]) Visit Report for 09/11/2020 Chief Complaint Document Details Patient Name: Date of Service: Leanne Chang, [place] NTO S 09/11/2020 9:15 A M Medical Record Number: [id number] Patient Account Number: [id number] Date of Birth/Sex: Treating RN: January 09, 1951 (69 y.o. Damaris Schooner Primary Care Provider: Hoy Register Other Clinician: Referring Provider: Treating Provider/Extender: Shana Chute, Enobong Weeks in Treatment: 2 Information Obtained from: Patient Chief Complaint Right great toe ulcer Electronic Signature(s) Signed: 09/11/2020 9:35:48 AM By: Lenda Kelp PA-C Entered By: Lenda Kelp on 09/11/2020 09:35:48 -------------------------------------------------------------------------------- Debridement Details Patient Name: Date of Service: Leanne Chang, SA NTO S 09/11/2020 9:15 A M Medical Record Number: [id number] Patient Account Number: [id number] Date of Birth/Sex: Treating RN: September 30, 1951 (69 y.o. Damaris Schooner Primary Care Provider: Hoy Register Other Clinician: Referring Provider: Treating Provider/Extender: Juleen China Weeks in Treatment: 2 Debridement Performed for Assessment: Wound #1 Right T Great oe Performed By: Physician Lenda Kelp, PA Debridement Type: Debridement Severity of Tissue Pre Debridement: Fat layer exposed Level of Consciousness (Pre-procedure): Awake and Alert Pre-procedure Verification/Time Out Yes - 09:35 Taken: Start Time: 09:36 Pain Control: Other : benzocaine 20% spray T Area Debrided (L x W): otal 1 (cm) x 0.8 (cm) = 0.8 (cm) Tissue and other material debrided: Viable, Non-Viable, Callus, Slough, Subcutaneous, Skin: Epidermis, Slough Level: Skin/Subcutaneous Tissue Debridement Description: Excisional Instrument: Curette Bleeding: Minimum Hemostasis Achieved: Pressure End Time: 09:40 Procedural Pain: 0 Post Procedural Pain: 0 Response to Treatment: Procedure was  tolerated well Level of Consciousness (Post- Awake and Alert procedure): Post Debridement Measurements of Total Wound Length: (cm) 0.5 Width: (cm) 0.4 Depth: (cm) 0.2 Volume: (cm) 0.031 Character of Wound/Ulcer Post Debridement: Improved Severity of Tissue Post Debridement: Fat layer exposed Post Procedure Diagnosis Same as Pre-procedure Electronic Signature(s) Signed: 09/11/2020 6:14:29 PM By: Zenaida Deed RN, BSN Signed: 09/11/2020 6:38:49 PM By: Lenda Kelp PA-C Entered By: Zenaida Deed on 09/11/2020 09:40:59 -------------------------------------------------------------------------------- HPI Details Patient Name: Date of Service: Leanne Chang, SA NTO S 09/11/2020 9:15 A M Medical Record Number: [id number] Patient Account Number: [id number] Date of Birth/Sex: Treating RN: 20-Feb-1951 (69 y.o. Damaris Schooner Primary Care Provider: Hoy Register Other Clinician: Referring Provider: Treating Provider/Extender: Shana Chute, Enobong Weeks in Treatment: 2 History of Present Illness HPI Description: 08/28/2020 upon evaluation today patient appears for initial evaluation here in the clinic concerning issues that he has been having with a wound on the right great toe. He is seen with the help of interpreter and his daughter is present during the evaluation today as well. He does have a history of diabetes mellitus type 2 for which she does take medication. He tells me that the wound has been present for a few weeks since at least around the beginning of August. He has been placed on doxycycline for 10 days which was on 07/23/2020. On 07/24/2020 his hemoglobin A1c was last checked and that was at 10.9. With that being said the patient tells me that he has been soaking his foot in hot water and then subsequently putting a dressing over top of this just seems to be little bit of a dry dressing possibly antibiotic ointment. Fortunately there is no signs of systemic infection  or even local infection in my opinion although he does seem to have some undermining likely due to the fact that he is getting pressure and friction to the wound surface causing this to remain open. 09/04/2020 upon evaluation today patient appears  to be doing very well with regard to his toe ulcer. I do feel like this is doing better in regard to the undermining and overall I think he is making good progress. Especially after 1 week. There does not appear to be any signs of active infection at this time. No fevers, chills, nausea, vomiting, or diarrhea. 09/11/2020 upon evaluation today patient appears to be doing excellent in regard to his wound today. Fortunately there is no signs of active infection and overall I am very pleased with where things stand. No fevers, chills, nausea, vomiting, or diarrhea. Electronic Signature(s) Signed: 09/11/2020 10:42:44 AM By: Lenda KelpStone III, Elexus Barman PA-C Entered By: Lenda KelpStone III, Markos Theil on 09/11/2020 10:42:44 -------------------------------------------------------------------------------- Physical Exam Details Patient Name: Date of Service: Leanne ChangV ILLA RES, SA NTO S 09/11/2020 9:15 A M Medical Record Number: [id number] Patient Account Number: [id number] Date of Birth/Sex: Treating RN: 1951-02-15 (69 y.o. Damaris SchoonerM) Boehlein, Linda Primary Care Provider: Hoy RegisterNewlin, Enobong Other Clinician: Referring Provider: Treating Provider/Extender: Shana ChuteStone III, Cicely Ortner Newlin, Enobong Weeks in Treatment: 2 Constitutional Well-nourished and well-hydrated in no acute distress. Respiratory normal breathing without difficulty. Psychiatric this patient is able to make decisions and demonstrates good insight into disease process. Alert and Oriented x 3. pleasant and cooperative. Notes Patient's wound bed showed signs of good granulation at this time. There does not appear to be any evidence of necrotic tissue noted and overall I feel like he is showing signs of good granulation and epithelization. I would  recommend that we continue with such with the current wound care measures. Electronic Signature(s) Signed: 09/11/2020 10:43:05 AM By: Lenda KelpStone III, Deneka Greenwalt PA-C Entered By: Lenda KelpStone III, Afra Tricarico on 09/11/2020 10:43:05 -------------------------------------------------------------------------------- Physician Orders Details Patient Name: Date of Service: Leanne ChangV ILLA RES, SA NTO S 09/11/2020 9:15 A M Medical Record Number: [id number] Patient Account Number: [id number] Date of Birth/Sex: Treating RN: 1951-02-15 (69 y.o. Damaris SchoonerM) Boehlein, Linda Primary Care Provider: Hoy RegisterNewlin, Enobong Other Clinician: Referring Provider: Treating Provider/Extender: Juleen ChinaStone III, Zuha Dejonge Newlin, Enobong Weeks in Treatment: 2 Verbal / Phone Orders: No Diagnosis Coding ICD-10 Coding Code Description E11.621 Type 2 diabetes mellitus with foot ulcer L97.512 Non-pressure chronic ulcer of other part of right foot with fat layer exposed Follow-up Appointments Return Appointment in 2 weeks. Dressing Change Frequency Wound #1 Right T Great oe Change Dressing every other day. Wound Cleansing Wound #1 Right T Great oe May shower and wash wound with soap and water. - wash right foot and toe with Dial antibacterial soap after shower Primary Wound Dressing Wound #1 Right T Great oe Calcium Alginate with Silver Secondary Dressing Wound #1 Right T Great oe Foam - foam donut Kerlix/Rolled Gauze Dry Gauze Off-Loading Open toe surgical shoe to: - right foot Electronic Signature(s) Signed: 09/11/2020 6:14:29 PM By: Zenaida DeedBoehlein, Linda RN, BSN Signed: 09/11/2020 6:38:49 PM By: Lenda KelpStone III, Kariann Wecker PA-C Entered By: Zenaida DeedBoehlein, Linda on 09/11/2020 09:41:39 -------------------------------------------------------------------------------- Problem List Details Patient Name: Date of Service: Leanne ChangV ILLA RES, SA NTO S 09/11/2020 9:15 A M Medical Record Number: [id number] Patient Account Number: [id number] Date of Birth/Sex: Treating RN: 1951-02-15 (69 y.o. Damaris SchoonerM)  Boehlein, Linda Primary Care Provider: Hoy RegisterNewlin, Enobong Other Clinician: Referring Provider: Treating Provider/Extender: Shana ChuteStone III, Kiefer Opheim Newlin, Enobong Weeks in Treatment: 2 Active Problems ICD-10 Encounter Code Description Active Date MDM Diagnosis E11.621 Type 2 diabetes mellitus with foot ulcer 08/28/2020 No Yes L97.512 Non-pressure chronic ulcer of other part of right foot with fat layer exposed 08/28/2020 No Yes Inactive Problems Resolved Problems Electronic Signature(s) Signed: 09/11/2020 9:35:42 AM By: Linwood DibblesStone III,  Leonard Schwartz PA-C Entered By: Lenda Kelp on 09/11/2020 09:35:42 -------------------------------------------------------------------------------- Progress Note Details Patient Name: Date of Service: Leanne Chang, [place] NTO S 09/11/2020 9:15 A M Medical Record Number: [id number] Patient Account Number: [id number] Date of Birth/Sex: Treating RN: 04-Nov-1951 (69 y.o. Damaris Schooner Primary Care Provider: Hoy Register Other Clinician: Referring Provider: Treating Provider/Extender: Juleen China Weeks in Treatment: 2 Subjective Chief Complaint Information obtained from Patient Right great toe ulcer History of Present Illness (HPI) 08/28/2020 upon evaluation today patient appears for initial evaluation here in the clinic concerning issues that he has been having with a wound on the right great toe. He is seen with the help of interpreter and his daughter is present during the evaluation today as well. He does have a history of diabetes mellitus type 2 for which she does take medication. He tells me that the wound has been present for a few weeks since at least around the beginning of August. He has been placed on doxycycline for 10 days which was on 07/23/2020. On 07/24/2020 his hemoglobin A1c was last checked and that was at 10.9. With that being said the patient tells me that he has been soaking his foot in hot water and then subsequently putting a dressing  over top of this just seems to be little bit of a dry dressing possibly antibiotic ointment. Fortunately there is no signs of systemic infection or even local infection in my opinion although he does seem to have some undermining likely due to the fact that he is getting pressure and friction to the wound surface causing this to remain open. 09/04/2020 upon evaluation today patient appears to be doing very well with regard to his toe ulcer. I do feel like this is doing better in regard to the undermining and overall I think he is making good progress. Especially after 1 week. There does not appear to be any signs of active infection at this time. No fevers, chills, nausea, vomiting, or diarrhea. 09/11/2020 upon evaluation today patient appears to be doing excellent in regard to his wound today. Fortunately there is no signs of active infection and overall I am very pleased with where things stand. No fevers, chills, nausea, vomiting, or diarrhea. Objective Constitutional Well-nourished and well-hydrated in no acute distress. Vitals Time Taken: 9:18 AM, Height: 60 in, Weight: 140 lbs, BMI: 27.3, Temperature: 98.5 F, Pulse: 83 bpm, Respiratory Rate: 18 breaths/min, Blood Pressure: 95/58 mmHg, Capillary Blood Glucose: 170 mg/dl. Respiratory normal breathing without difficulty. Psychiatric this patient is able to make decisions and demonstrates good insight into disease process. Alert and Oriented x 3. pleasant and cooperative. General Notes: Patient's wound bed showed signs of good granulation at this time. There does not appear to be any evidence of necrotic tissue noted and overall I feel like he is showing signs of good granulation and epithelization. I would recommend that we continue with such with the current wound care measures. Integumentary (Hair, Skin) Wound #1 status is Open. Original cause of wound was Trauma. The wound is located on the Right T Great. The wound measures 0.5cm length x  0.4cm width oe x 0.2cm depth; 0.157cm^2 area and 0.031cm^3 volume. There is Fat Layer (Subcutaneous Tissue) exposed. There is no tunneling or undermining noted. There is a medium amount of serosanguineous drainage noted. The wound margin is distinct with the outline attached to the wound base. There is large (67-100%) red granulation within the wound bed. There is no necrotic tissue within  the wound bed. Assessment Active Problems ICD-10 Type 2 diabetes mellitus with foot ulcer Non-pressure chronic ulcer of other part of right foot with fat layer exposed Procedures Wound #1 Pre-procedure diagnosis of Wound #1 is a Diabetic Wound/Ulcer of the Lower Extremity located on the Right T Great .Severity of Tissue Pre Debridement is: oe Fat layer exposed. There was a Excisional Skin/Subcutaneous Tissue Debridement with a total area of 0.8 sq cm performed by Lenda Kelp, PA. With the following instrument(s): Curette to remove Viable and Non-Viable tissue/material. Material removed includes Callus, Subcutaneous Tissue, Slough, and Skin: Epidermis after achieving pain control using Other (benzocaine 20% spray). No specimens were taken. A time out was conducted at 09:35, prior to the start of the procedure. A Minimum amount of bleeding was controlled with Pressure. The procedure was tolerated well with a pain level of 0 throughout and a pain level of 0 following the procedure. Post Debridement Measurements: 0.5cm length x 0.4cm width x 0.2cm depth; 0.031cm^3 volume. Character of Wound/Ulcer Post Debridement is improved. Severity of Tissue Post Debridement is: Fat layer exposed. Post procedure Diagnosis Wound #1: Same as Pre-Procedure Plan Follow-up Appointments: Return Appointment in 2 weeks. Dressing Change Frequency: Wound #1 Right T Great: oe Change Dressing every other day. Wound Cleansing: Wound #1 Right T Great: oe May shower and wash wound with soap and water. - wash right foot and toe  with Dial antibacterial soap after shower Primary Wound Dressing: Wound #1 Right T Great: oe Calcium Alginate with Silver Secondary Dressing: Wound #1 Right T Great: oe Foam - foam donut Kerlix/Rolled Gauze Dry Gauze Off-Loading: Open toe surgical shoe to: - right foot 1. I am going to suggest we continue with the wound care measures as before specifically with regard to the silver alginate which I think is done a good job for him. We may switch to collagen in the future but right now I would say that stick with the alginate. 2. Also get a continue recommendation for the foam doughnut for offloading. 3. I also recommend the patient continue to try to avoid ambulation that is not necessary I know he does have to work he tells me he feels okay when he works as well that is good news. We will see patient back for reevaluation in 2 weeks here in the clinic. If anything worsens or changes patient will contact our office for additional recommendations. Electronic Signature(s) Signed: 09/11/2020 10:43:51 AM By: Lenda Kelp PA-C Entered By: Lenda Kelp on 09/11/2020 10:43:51 -------------------------------------------------------------------------------- SuperBill Details Patient Name: Date of Service: Leanne Chang, SA NTO S 09/11/2020 Medical Record Number: [id number] Patient Account Number: [id number] Date of Birth/Sex: Treating RN: Feb 15, 1951 (69 y.o. Damaris Schooner Primary Care Provider: Hoy Register Other Clinician: Referring Provider: Treating Provider/Extender: Shana Chute, Enobong Weeks in Treatment: 2 Diagnosis Coding ICD-10 Codes Code Description E11.621 Type 2 diabetes mellitus with foot ulcer L97.512 Non-pressure chronic ulcer of other part of right foot with fat layer exposed Facility Procedures CPT4 Code: [id number] Description: 11042 - DEB SUBQ TISSUE 20 SQ CM/< ICD-10 Diagnosis Description L97.512 Non-pressure chronic ulcer of other part of right  foot with fat layer exposed Modifier: Quantity: 1 Physician Procedures : CPT4 Code Description Modifier [id number] 11042 - WC PHYS SUBQ TISS 20 SQ CM ICD-10 Diagnosis Description L97.512 Non-pressure chronic ulcer of other part of right foot with fat layer exposed Quantity: 1 Electronic Signature(s) Signed: 09/11/2020 10:44:08 AM By: Lenda Kelp PA-C Entered By: Linwood Dibbles,  Tiwana Chavis on 09/11/2020 10:44:08

## 2020-09-25 ENCOUNTER — Encounter (HOSPITAL_BASED_OUTPATIENT_CLINIC_OR_DEPARTMENT_OTHER): Payer: Self-pay | Admitting: Physician Assistant

## 2020-11-11 ENCOUNTER — Ambulatory Visit (HOSPITAL_COMMUNITY)
Admission: RE | Admit: 2020-11-11 | Discharge: 2020-11-11 | Disposition: A | Discharge status: Home / Self Care | Payer: Self-pay | Source: Ambulatory Visit | Attending: *Deleted | Admitting: *Deleted

## 2020-11-11 ENCOUNTER — Other Ambulatory Visit (HOSPITAL_COMMUNITY): Payer: Self-pay | Admitting: *Deleted

## 2020-11-11 ENCOUNTER — Other Ambulatory Visit: Payer: Self-pay

## 2020-11-11 DIAGNOSIS — R051 Acute cough: Secondary | ICD-10-CM

## 2021-02-18 IMAGING — DX DG CHEST 1V PORT
1 series · 1 of 1 positions shown · non-contrast
Comparison: None.

CLINICAL DATA: History of U5JDJ-VX positivity with persistent
weakness and fatigue

EXAM:
PORTABLE CHEST 1 VIEW

[chest ap]
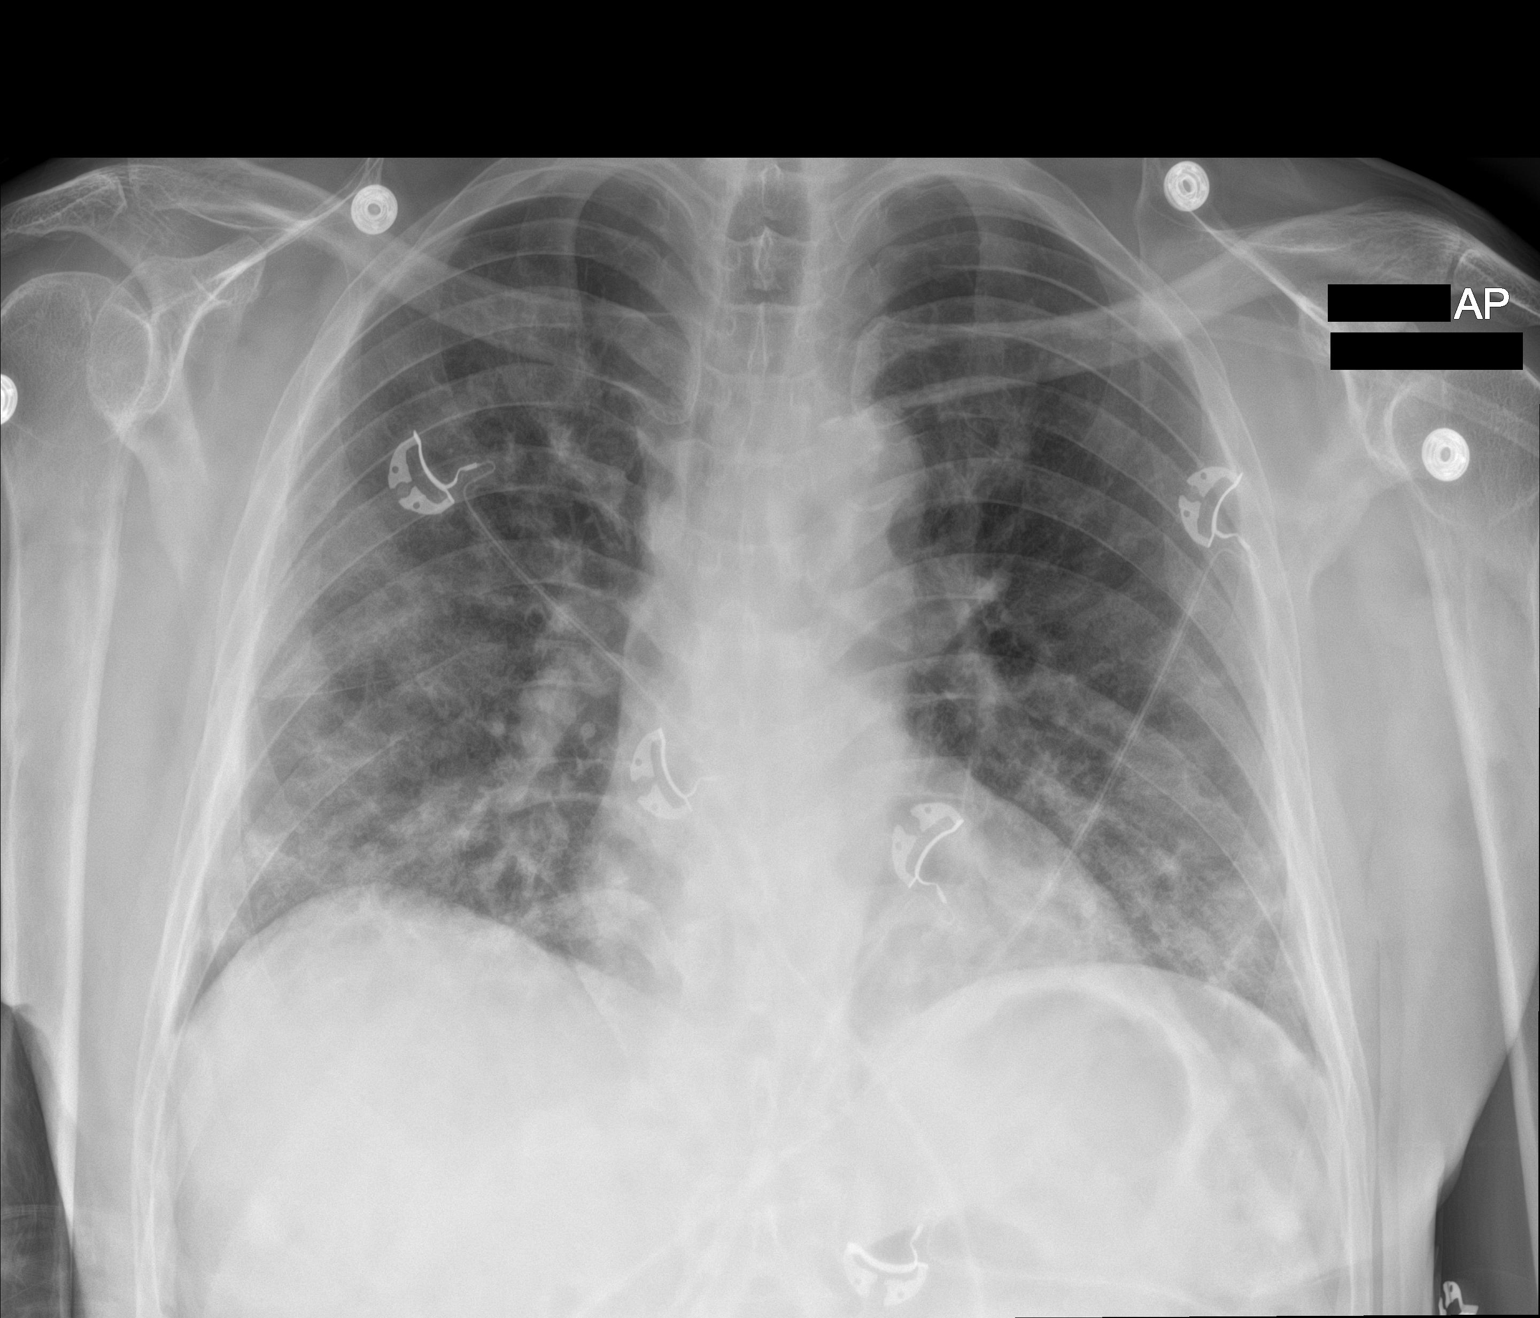

[1 of 1 positions shown; findings below may reference images not displayed]

FINDINGS: Cardiac shadow is within normal limits. The lungs are well aerated
bilaterally. Patchy opacities are identified bilaterally consistent
with the given clinical history. No bony abnormality is seen.
IMPRESSION: Patchy opacities consistent with the clinical history of U5JDJ-VX
positivity.

## 2021-02-23 IMAGING — DX DG CHEST 1V PORT
1 series · 1 of 1 positions shown · non-contrast
Comparison: 01/24/2020

CLINICAL DATA: Hypoxemia.

EXAM:
PORTABLE CHEST 1 VIEW

[chest]
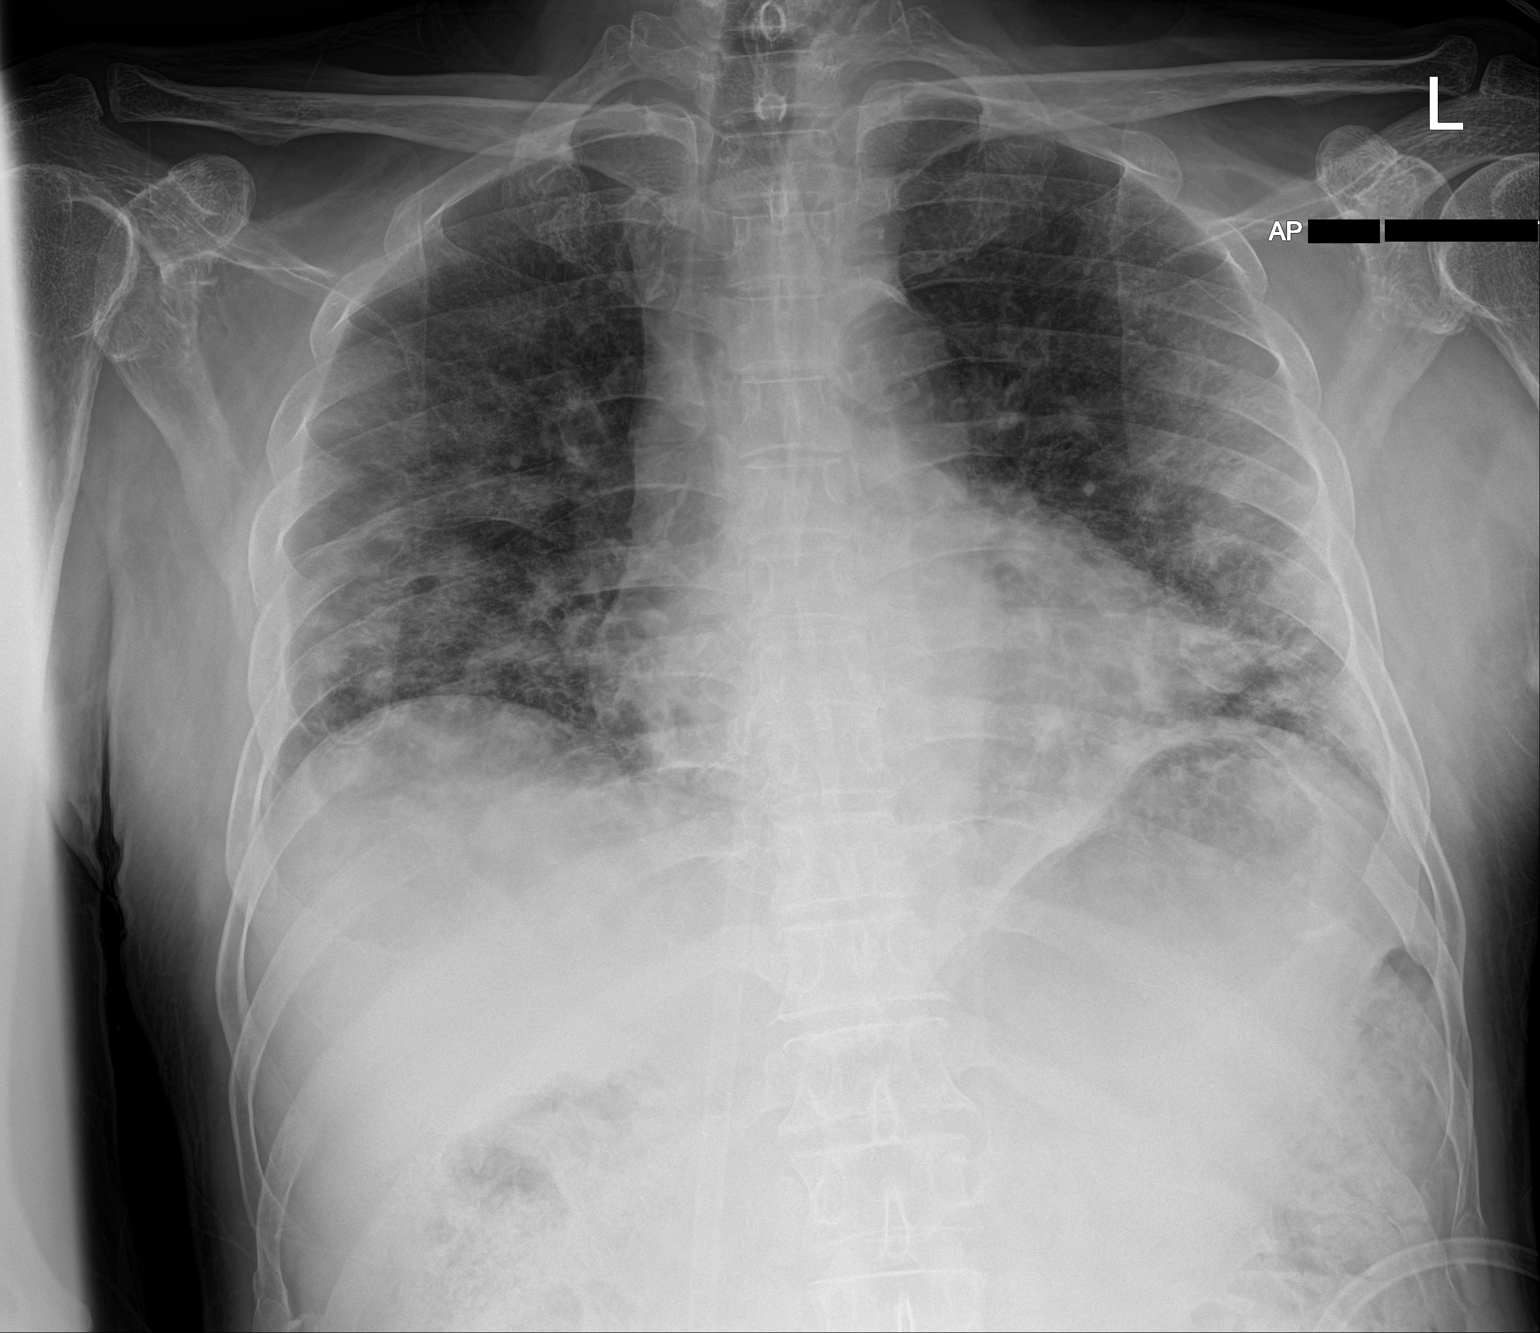

[1 of 1 positions shown; findings below may reference images not displayed]

FINDINGS: Patchy bilateral airspace opacities with a slight peripheral
predominance and right greater than left distribution are similar to
prior study. No pleural effusion. Cardiopericardial silhouette is at
upper limits of normal for size. The visualized bony structures of
the thorax are intact.
IMPRESSION: Stable appearance patchy bilateral airspace opacity.

## 2023-08-13 NOTE — Progress Notes (Signed)
This encounter was created in error - please disregard.

## 2024-04-19 ENCOUNTER — Telehealth: Payer: Self-pay

## 2024-04-19 NOTE — Telephone Encounter (Signed)
 Attempted follow up call to [facility] client with interpreter services. No answer. Unable to leave message. He was last seen at [facility] on 04/12/24 and next appointment is 07/10/24 He is Diabetic and last A1C 7.9 on 04/12/24  Plan to continue to attempt to follow up and contact to provide Diabetic support and education as well as any other possible services needed.   Kris Pester RN [employer]
